# Patient Record
Sex: Female | Born: 1935 | Race: White | Hispanic: No | Smoking: Never smoker
Health system: Southern US, Community
[De-identification: ages and names within clinical notes are randomized; demographics above are authoritative.]

## PROBLEM LIST (undated history)

## (undated) DIAGNOSIS — K5900 Constipation, unspecified: Secondary | ICD-10-CM

## (undated) DIAGNOSIS — I4891 Unspecified atrial fibrillation: Secondary | ICD-10-CM

## (undated) DIAGNOSIS — M1990 Unspecified osteoarthritis, unspecified site: Secondary | ICD-10-CM

## (undated) DIAGNOSIS — E785 Hyperlipidemia, unspecified: Secondary | ICD-10-CM

## (undated) DIAGNOSIS — K222 Esophageal obstruction: Secondary | ICD-10-CM

## (undated) DIAGNOSIS — Z9981 Dependence on supplemental oxygen: Secondary | ICD-10-CM

## (undated) DIAGNOSIS — J9610 Chronic respiratory failure, unspecified whether with hypoxia or hypercapnia: Secondary | ICD-10-CM

## (undated) DIAGNOSIS — E871 Hypo-osmolality and hyponatremia: Secondary | ICD-10-CM

## (undated) DIAGNOSIS — I10 Essential (primary) hypertension: Secondary | ICD-10-CM

## (undated) DIAGNOSIS — G4700 Insomnia, unspecified: Secondary | ICD-10-CM

## (undated) DIAGNOSIS — K449 Diaphragmatic hernia without obstruction or gangrene: Secondary | ICD-10-CM

## (undated) DIAGNOSIS — K2970 Gastritis, unspecified, without bleeding: Secondary | ICD-10-CM

## (undated) DIAGNOSIS — F0390 Unspecified dementia without behavioral disturbance: Secondary | ICD-10-CM

## (undated) DIAGNOSIS — F419 Anxiety disorder, unspecified: Secondary | ICD-10-CM

## (undated) DIAGNOSIS — E039 Hypothyroidism, unspecified: Secondary | ICD-10-CM

## (undated) DIAGNOSIS — E119 Type 2 diabetes mellitus without complications: Secondary | ICD-10-CM

## (undated) HISTORY — DX: Hypothyroidism, unspecified: E03.9

## (undated) HISTORY — DX: Chronic respiratory failure, unspecified whether with hypoxia or hypercapnia: J96.10

## (undated) HISTORY — PX: CHOLECYSTECTOMY: SHX55

## (undated) HISTORY — DX: Insomnia, unspecified: G47.00

## (undated) HISTORY — PX: ABDOMINAL HYSTERECTOMY: SHX81

## (undated) HISTORY — PX: THYROID SURGERY: SHX805

## (undated) HISTORY — DX: Esophageal obstruction: K22.2

## (undated) HISTORY — PX: APPENDECTOMY: SHX54

## (undated) HISTORY — DX: Hypo-osmolality and hyponatremia: E87.1

## (undated) HISTORY — PX: TONSILLECTOMY: SUR1361

## (undated) HISTORY — DX: Unspecified dementia, unspecified severity, without behavioral disturbance, psychotic disturbance, mood disturbance, and anxiety: F03.90

## (undated) HISTORY — DX: Unspecified osteoarthritis, unspecified site: M19.90

## (undated) HISTORY — DX: Diaphragmatic hernia without obstruction or gangrene: K44.9

## (undated) HISTORY — DX: Anxiety disorder, unspecified: F41.9

## (undated) HISTORY — DX: Dependence on supplemental oxygen: Z99.81

## (undated) HISTORY — PX: OTHER SURGICAL HISTORY: SHX169

## (undated) HISTORY — DX: Hyperlipidemia, unspecified: E78.5

## (undated) HISTORY — DX: Gastritis, unspecified, without bleeding: K29.70

## (undated) HISTORY — DX: Constipation, unspecified: K59.00

## (undated) HISTORY — DX: Type 2 diabetes mellitus without complications: E11.9

## (undated) HISTORY — DX: Unspecified atrial fibrillation: I48.91

## (undated) HISTORY — DX: Essential (primary) hypertension: I10

---

## 2020-01-21 DIAGNOSIS — I4510 Unspecified right bundle-branch block: Secondary | ICD-10-CM | POA: Insufficient documentation

## 2021-01-11 ENCOUNTER — Institutional Professional Consult (permissible substitution): Payer: Medicare Other | Admitting: Emergency Medicine

## 2021-01-12 ENCOUNTER — Encounter: Payer: Self-pay | Admitting: Pulmonary Disease

## 2021-01-12 DIAGNOSIS — E119 Type 2 diabetes mellitus without complications: Secondary | ICD-10-CM | POA: Insufficient documentation

## 2021-01-13 ENCOUNTER — Ambulatory Visit (INDEPENDENT_AMBULATORY_CARE_PROVIDER_SITE_OTHER): Payer: Medicare Other

## 2021-01-13 ENCOUNTER — Ambulatory Visit (INDEPENDENT_AMBULATORY_CARE_PROVIDER_SITE_OTHER): Payer: Medicare Other | Admitting: Pulmonary Disease

## 2021-01-13 ENCOUNTER — Encounter: Payer: Self-pay | Admitting: Pulmonary Disease

## 2021-01-13 ENCOUNTER — Other Ambulatory Visit: Payer: Self-pay

## 2021-01-13 VITALS — BP 116/74 | HR 51

## 2021-01-13 DIAGNOSIS — R0902 Hypoxemia: Secondary | ICD-10-CM | POA: Diagnosis not present

## 2021-01-13 NOTE — Patient Instructions (Addendum)
Nice to meet you  We will get a chest x-ray to try to figure out what is going on  I think most likely there is recurrent aspiration even of oropharyngeal secretions that is causing ongoing insult to the lung as well as atelectasis from not being active.  Return to clinic in 3 months or sooner as needed

## 2021-01-13 NOTE — Progress Notes (Signed)
@Patient  ID: , female    DOB: [birth year], 87 y.o.   MRN: [id number]  Chief Complaint  Patient presents with   Consult    Referred from nursing facility for recurrent PNA and coughing up blood about a week ago. Daughter states the blood that she coughed up was a quarter size spot, brownish black color. Has had PNA since at least May 2022 after a fall in June 2022.      Referring provider: [place], NP  HPI:   87 y.o. whom we are seeing in consultation for evaluation of hypoxemic respiratory failure and reported recurrent pneumonia.  Note from skilled nursing facility reviewed.  History largely obtained from daughters 1 at visit and another via phone.  She had recurrent falls at her skilled nursing facility most recent second fall May 2022.  She did up in the hospital.  She was treated for "pneumonia."  I cannot see images to verify infiltrate or other abnormality on chest imaging.  However, there is description of nausea, vomiting, esophageal stenosis and inability to take p.o., food, pills, liquids getting stuck.  Sounds like an aspiration event.  She was treated with antibiotics.  She was moved here locally after that incident.  She has been on oxygen since.  There has been no repeat chest x-ray that I can see.  They do not think repeat chest x-rays been done since in June 2022.  They mention recurrent pneumonias.  Discussed that I have no evidence of recurrent pneumonia, no images to review.  Discussed in detail that high suspicion for ongoing aspiration of oropharyngeal secretions given her advanced cognitive impairment as well as her esophageal stenosis/dysmotility.  In addition, it is common to aspirate G-tube feeds via GERD or reflux with high suspicion that could be going on.  Lastly, discussed physiology of atelectasis and given her lack of activity, largely bedbound, degree of shunt physiology is likely contributing to her oxygen need.  They wish to minimize trips  out of her facility.  But they do not try to get to the bottom of what is going on.  Denies any significant respiratory issues preceding fall in May 2022.  She had COVID earlier in the spring and was just more like a cold, runny nose, little bit of a cough, no respiratory issues.  No history of recurrent pneumonias or bronchitis.  No history of asthma or respiratory issues as a child.  PMH: Obtained via EMR due to patient's advanced cognitive impairment, history of C. difficile, dementia, diabetes, subdural stenosis, arthritis, hypertension, atrial fibrillation Surgical history: Unable to obtain due to advanced dementia Family history: Discussed with daughters, no respiratory illness vertigo fluids, cannot obtain from patient due to advanced dementia, social history: Never smoker, moved [place] 11[facility]19[facility] [place] hospitalization summer 2022 in [place] / Pulmonary Flowsheets:   ACT:  No flowsheet data found.  MMRC: No flowsheet data found.  Epworth:  No flowsheet data found.  Tests:   FENO:  No results found for: NITRICOXIDE  PFT: No flowsheet data found.  WALK:  No flowsheet data found.  Imaging: No results found.  Lab Results:  CBC No results found for: WBC, RBC, HGB, HCT, PLT, MCV, MCH, MCHC, RDW, LYMPHSABS, MONOABS, EOSABS, BASOSABS  BMET No results found for: NA, K, CL, CO2, GLUCOSE, BUN, CREATININE, CALCIUM, GFRNONAA, GFRAA  BNP No results found for: BNP  ProBNP No results found for: PROBNP  Specialty Problems   None  Allergies  Allergen Reactions   Ciprofloxacin    Crestor [Rosuvastatin]    Hydrocodone    Latex    Sulfadiazine      There is no immunization history on file for this patient.  Past Medical History:  Diagnosis Date   Anxiety    Arthritis    Atrial fibrillation (HCC)    Chronic respiratory failure (HCC)    Dementia (HCC)    Esophageal stenosis status post PEG placement     Hyperlipidemia    Hypertension    Hyponatremia    Hypothyroidism    Insomnia    Type 2 diabetes mellitus (HCC)     Tobacco History: Social History   Tobacco Use  Smoking Status Never  Smokeless Tobacco Never   Counseling given: Not Answered   Continue to not smoke  Outpatient Encounter Medications as of 01/13/2021  Medication Sig   acetaminophen (TYLENOL) 325 MG tablet Take 325 mg by mouth.   ASPIRIN LOW DOSE 81 MG chewable tablet Chew 81 mg by mouth daily.   bisacodyl (DULCOLAX) 10 MG suppository Place 10 mg rectally daily.   CALCIUM + VITAMIN D3 600-10 MG-MCG TABS Take by mouth daily as needed.   cholecalciferol (VITAMIN D) 25 MCG (1000 UNIT) tablet Take 1,000 Units by mouth daily.   cholestyramine (QUESTRAN) 4 GM/DOSE powder Take by mouth.   diclofenac Sodium (VOLTAREN) 1 % GEL Apply topically.   diltiazem (CARDIZEM) 120 MG tablet Take 120 mg by mouth 2 (two) times daily.   estradiol (ESTRACE) 0.1 MG/GM vaginal cream Place vaginally.   famotidine (PEPCID) 20 MG tablet Take 20 mg by mouth daily.   furosemide (LASIX) 20 MG tablet Take 20 mg by mouth daily.   gabapentin (NEURONTIN) 250 MG/5ML solution Take by mouth.   levothyroxine (SYNTHROID) 125 MCG tablet Take 125 mcg by mouth daily.   linezolid (ZYVOX) 600 MG tablet Take 600 mg by mouth 2 (two) times daily.   LORazepam (ATIVAN) 0.5 MG tablet Take 0.5 mg by mouth daily as needed.   melatonin 3 MG TABS tablet Take 3 mg by mouth at bedtime.   metoprolol tartrate (LOPRESSOR) 25 MG tablet Take 25 mg by mouth 2 (two) times daily.   nitroGLYCERIN (NITROSTAT) 0.4 MG SL tablet Place under the tongue.   NOVOLOG FLEXPEN 100 UNIT/ML FlexPen Inject into the skin.   NYAMYC powder Apply topically.   OXcarbazepine (TRILEPTAL) 150 MG tablet Take 150 mg by mouth 2 (two) times daily.   pantoprazole sodium (PROTONIX) 40 mg Take by mouth.   polyethylene glycol powder (GLYCOLAX/MIRALAX) 17 GM/SCOOP powder Take 17 g by mouth daily.    Probiotic Product (BACID) CAPS Take by mouth.   QUEtiapine (SEROQUEL) 25 MG tablet Take by mouth.   sertraline (ZOLOFT) 25 MG tablet Take 25 mg by mouth daily.   spironolactone (ALDACTONE) 25 MG tablet Take by mouth.   traMADol (ULTRAM) 50 MG tablet Take 50 mg by mouth every 6 (six) hours as needed.   traZODone (DESYREL) 50 MG tablet Take 50 mg by mouth at bedtime.   zinc oxide 20 % ointment Apply topically.   No facility-administered encounter medications on file as of 01/13/2021.     Review of Systems  Review of Systems  Unobtainable due to patient's dementia Physical Exam  BP 116/74 (BP Location: Right Arm, Patient Position: Sitting, Cuff Size: Normal)   Pulse (!) 51   SpO2 100% Comment: on 2L  Wt Readings from Last 5 Encounters:  No data found for [employer]  BMI Readings from Last 5 Encounters:  No data found for BMI     Physical Exam General: Chronically ill-appearing, lying in portable chair Eyes: EOMI, no icterus Neck: Supple, no JVP appreciated Pulmonary: Clear anterior and apical posterior lung fields, normal work of breathing, on nasal cannula Cardiovascular: Regular rate and rhythm, no murmur Abdomen: Nondistended, G-tube in place, clean dry and intact insertion MSK: No synovitis, joint effusion Neuro: No focal deficit, bedbound Psych: Flat affect, inattentive, sometimes respond to questions   Assessment & Plan:   Chronic hypoxemic respiratory failure: On nasal cannula at visit today.  Unclear if true oxygen requirement, no real records to review.  Stems from hospitalization after fall 08/2020.  Based on description of events almost certain there is a component of aspiration pneumonia.  High suspicion for ongoing aspiration of oropharyngeal secretions as well as risk of reflux of tube feeds and aspiration in the setting of her advanced cognitive impairment.  In addition, she is essentially bedbound, spends most of her day on her back.  High suspicion for ongoing  atelectasis and shunt physiology.   --Chest x-ray today   Return in about 3 months (around 04/14/2021).   Karren Burly, MD 01/13/2021  I spent 65 minutes in the care of this patient including review of outside records, face-to-face care, coordination of care.

## 2021-01-20 ENCOUNTER — Other Ambulatory Visit: Payer: Self-pay | Admitting: Gastroenterology

## 2021-01-20 ENCOUNTER — Other Ambulatory Visit (HOSPITAL_COMMUNITY): Payer: Self-pay | Admitting: Gastroenterology

## 2021-01-20 DIAGNOSIS — R1310 Dysphagia, unspecified: Secondary | ICD-10-CM

## 2021-01-25 ENCOUNTER — Other Ambulatory Visit (HOSPITAL_COMMUNITY): Payer: Self-pay | Admitting: Gastroenterology

## 2021-01-25 ENCOUNTER — Other Ambulatory Visit: Payer: Self-pay

## 2021-01-25 ENCOUNTER — Ambulatory Visit (HOSPITAL_COMMUNITY)
Admission: RE | Admit: 2021-01-25 | Discharge: 2021-01-25 | Disposition: A | Discharge status: Home / Self Care | Payer: Medicare Other | Source: Ambulatory Visit | Attending: Gastroenterology | Admitting: Gastroenterology

## 2021-01-25 DIAGNOSIS — R1310 Dysphagia, unspecified: Secondary | ICD-10-CM

## 2021-01-26 ENCOUNTER — Other Ambulatory Visit (HOSPITAL_COMMUNITY): Payer: Self-pay | Admitting: Gastroenterology

## 2021-01-26 ENCOUNTER — Other Ambulatory Visit: Payer: Self-pay | Admitting: Gastroenterology

## 2021-01-26 DIAGNOSIS — R1310 Dysphagia, unspecified: Secondary | ICD-10-CM

## 2021-02-09 ENCOUNTER — Ambulatory Visit (HOSPITAL_COMMUNITY): Admission: RE | Admit: 2021-02-09 | Payer: Medicare Other | Source: Ambulatory Visit

## 2021-02-16 ENCOUNTER — Ambulatory Visit (HOSPITAL_COMMUNITY)
Admission: RE | Admit: 2021-02-16 | Discharge: 2021-02-16 | Disposition: A | Discharge status: Home / Self Care | Payer: Medicare Other | Source: Ambulatory Visit | Attending: Gastroenterology | Admitting: Gastroenterology

## 2021-02-16 DIAGNOSIS — R1310 Dysphagia, unspecified: Secondary | ICD-10-CM | POA: Diagnosis not present

## 2021-02-17 ENCOUNTER — Other Ambulatory Visit (HOSPITAL_COMMUNITY): Payer: Self-pay | Admitting: *Deleted

## 2021-02-17 DIAGNOSIS — R1310 Dysphagia, unspecified: Secondary | ICD-10-CM

## 2021-02-17 DIAGNOSIS — R059 Cough, unspecified: Secondary | ICD-10-CM

## 2021-02-24 ENCOUNTER — Ambulatory Visit (HOSPITAL_COMMUNITY)
Admission: RE | Admit: 2021-02-24 | Discharge: 2021-02-24 | Disposition: A | Discharge status: Home / Self Care | Payer: Medicare Other | Source: Ambulatory Visit | Attending: Gastroenterology | Admitting: Gastroenterology

## 2021-02-24 ENCOUNTER — Other Ambulatory Visit: Payer: Self-pay

## 2021-02-24 DIAGNOSIS — R059 Cough, unspecified: Secondary | ICD-10-CM | POA: Diagnosis present

## 2021-02-24 DIAGNOSIS — R1310 Dysphagia, unspecified: Secondary | ICD-10-CM | POA: Diagnosis present

## 2021-02-24 NOTE — Progress Notes (Signed)
Objective Swallowing Evaluation: Type of Study: MBS-Modified Barium Swallow Study   Patient Details  Name: Sarah Boone MRN: [id number] Date of Birth: 1933-05-31  Today's Date: 02/24/2021 Time: SLP Start Time (ACUTE ONLY): 1120 -SLP Stop Time (ACUTE ONLY): 1148  SLP Time Calculation (min) (ACUTE ONLY): 28 min   Past Medical History:  Past Medical History:  Diagnosis Date   Anxiety    Arthritis    Atrial fibrillation (HCC)    Chronic respiratory failure (HCC)    Dementia (HCC)    Esophageal stenosis status post PEG placement    Hyperlipidemia    Hypertension    Hyponatremia    Hypothyroidism    Insomnia    Type 2 diabetes mellitus (HCC)    Past Surgical History:  Past Surgical History:  Procedure Laterality Date   THYROID SURGERY     vaginal polyps excision     HPI: Pt is a 87 y.o. female who presents for OP MBS to see if able to initate oral diet given pt's primary nutritional needs are being fulfilled via PEG tube. There have also been some recent concerns for aspiration PNA with daughter reporting pending CXR. Upper GI series perfomed 02/16/21 revealed: Moderate esophageal dysmotility; No frank aspiration seen on limited cervical esophagram with suboptimal views. PMH:  difficile, dementia, diabetes, subdural stenosis, arthritis, hypertension, atrial fibrillation.   No data recorded   Assessment / Plan / Recommendation  CHL IP CLINICAL IMPRESSIONS 02/24/2021  Clinical Impression Pt presents with oropharyngeal dysphagia c/b decreased bolus cohesion, reduced BOT retraction and laryngeal vestibule closure resulting in consistent penetration of thin liquids with full ejection from airway (PAS 2). Reduced bolus cohesion was evident in min oral residuals, cleared with spontaneous repeat swallows. Minimal vallecular and pyriform sinus residue also noted, primarily with thin liquids, which were also cleared with dry swallows via strength of pharyngeal stripping wave. Intermittent  coughing exhibited during study, however no aspiration observed via fluoro and suspect no relation to POs. Pill with thin hung in cervical esophagus and cleared with sip of NTL. Recommend initation of dysphagia 3 (mechanical soft), thin liquid diet with adherence to universal swallow precautions (small bites/sips, upright positioning, slow rate of intake, etc.) Pt should be fully supervised during all meals to increase safety and implementation of strategies. Recommend SNF SLP to f/u in regards to swallow function at bedside as care team/family initiates process of removal of PEG tube. Discussed results and recommendations with pt and daughter who expressed agreement.   SLP Visit Diagnosis Dysphagia, oropharyngeal phase (R13.12)  Attention and concentration deficit following --  Frontal lobe and executive function deficit following --  Impact on safety and function Mild aspiration risk      No flowsheet data found.   Prognosis 02/24/2021  Prognosis for Safe Diet Advancement Good  Barriers to Reach Goals Cognitive deficits;Time post onset  Barriers/Prognosis Comment --    CHL IP DIET RECOMMENDATION 02/24/2021  SLP Diet Recommendations Thin liquid;Dysphagia 3 (Mech soft) solids  Liquid Administration via Cup;Straw  Medication Administration Whole meds with liquid  Compensations Minimize environmental distractions;Slow rate;Small sips/bites;Clear throat intermittently;Effortful swallow  Postural Changes Seated upright at 90 degrees;Remain semi-upright after after feeds/meals (Comment)      CHL IP OTHER RECOMMENDATIONS 02/24/2021  Recommended Consults --  Oral Care Recommendations Oral care BID  Other Recommendations --      CHL IP FOLLOW UP RECOMMENDATIONS 02/24/2021  Follow up Recommendations Skilled Nursing facility      No flowsheet data found.  CHL IP ORAL PHASE 02/24/2021  Oral Phase Impaired  Oral - Pudding Teaspoon --  Oral - Pudding Cup --  Oral - Honey Teaspoon  --  Oral - Honey Cup --  Oral - Nectar Teaspoon --  Oral - Nectar Cup --  Oral - Nectar Straw --  Oral - Thin Teaspoon --  Oral - Thin Cup Decreased bolus cohesion  Oral - Thin Straw Decreased bolus cohesion  Oral - Puree Decreased bolus cohesion;Lingual/palatal residue  Oral - Mech Soft --  Oral - Regular Decreased bolus cohesion;Lingual/palatal residue  Oral - Multi-Consistency --  Oral - Pill Decreased bolus cohesion  Oral Phase - Comment --    CHL IP PHARYNGEAL PHASE 02/24/2021  Pharyngeal Phase Impaired  Pharyngeal- Pudding Teaspoon --  Pharyngeal --  Pharyngeal- Pudding Cup --  Pharyngeal --  Pharyngeal- Honey Teaspoon --  Pharyngeal --  Pharyngeal- Honey Cup --  Pharyngeal --  Pharyngeal- Nectar Teaspoon --  Pharyngeal --  Pharyngeal- Nectar Cup --  Pharyngeal --  Pharyngeal- Nectar Straw --  Pharyngeal --  Pharyngeal- Thin Teaspoon --  Pharyngeal --  Pharyngeal- Thin Cup Penetration/Aspiration during swallow;Reduced airway/laryngeal closure;Pharyngeal residue - pyriform;Pharyngeal residue - valleculae  Pharyngeal Material enters airway, remains ABOVE vocal cords then ejected out  Pharyngeal- Thin Straw Penetration/Aspiration during swallow;Reduced airway/laryngeal closure;Pharyngeal residue - pyriform;Pharyngeal residue - valleculae  Pharyngeal Material enters airway, remains ABOVE vocal cords then ejected out  Pharyngeal- Puree WFL  Pharyngeal --  Pharyngeal- Mechanical Soft --  Pharyngeal --  Pharyngeal- Regular WFL  Pharyngeal --  Pharyngeal- Multi-consistency --  Pharyngeal --  Pharyngeal- Pill WFL  Pharyngeal --  Pharyngeal Comment --     CHL IP CERVICAL ESOPHAGEAL PHASE 02/24/2021  Cervical Esophageal Phase Impaired  Pudding Teaspoon --  Pudding Cup --  Honey Teaspoon --  Honey Cup --  Nectar Teaspoon --  Nectar Cup --  Nectar Straw --  Thin Teaspoon --  Thin Cup --  Thin Straw --  Puree --  Mechanical Soft --  Regular --   Multi-consistency --  Pill Other (Comment)  Cervical Esophageal Comment --    Avie Echevaria, MA, CCC-SLP Acute Rehabilitation Services Office Number: [phone]  Paulette Blanch 02/24/2021, 2:56 PM

## 2021-03-02 ENCOUNTER — Telehealth: Payer: Self-pay | Admitting: Rehabilitative and Restorative Service Providers"

## 2021-03-02 NOTE — Telephone Encounter (Signed)
Called and spoke with daughter and then facility in order to cancel SLP eval for MOnday.  Patient resides in SNF and should receive services at facility.  Called to ensure transportation from facility was cancelled. Shante Archambeault, PT

## 2021-03-06 ENCOUNTER — Ambulatory Visit: Payer: Medicare Other

## 2021-03-09 ENCOUNTER — Other Ambulatory Visit (HOSPITAL_COMMUNITY): Payer: Self-pay | Admitting: Gastroenterology

## 2021-03-09 DIAGNOSIS — R1310 Dysphagia, unspecified: Secondary | ICD-10-CM

## 2021-03-09 DIAGNOSIS — Z431 Encounter for attention to gastrostomy: Secondary | ICD-10-CM

## 2021-03-10 ENCOUNTER — Ambulatory Visit (HOSPITAL_COMMUNITY)
Admission: RE | Admit: 2021-03-10 | Discharge: 2021-03-10 | Disposition: A | Discharge status: Home / Self Care | Payer: Medicare Other | Source: Ambulatory Visit | Attending: Gastroenterology | Admitting: Gastroenterology

## 2021-03-10 ENCOUNTER — Other Ambulatory Visit: Payer: Self-pay

## 2021-03-10 ENCOUNTER — Encounter (HOSPITAL_COMMUNITY): Payer: Self-pay | Admitting: Radiology

## 2021-03-10 ENCOUNTER — Other Ambulatory Visit (HOSPITAL_COMMUNITY): Payer: Self-pay | Admitting: Gastroenterology

## 2021-03-10 DIAGNOSIS — R1310 Dysphagia, unspecified: Secondary | ICD-10-CM

## 2021-03-10 DIAGNOSIS — Z431 Encounter for attention to gastrostomy: Secondary | ICD-10-CM

## 2021-03-10 HISTORY — PX: IR PATIENT EVAL TECH 0-60 MINS: IMG5564

## 2021-03-10 NOTE — Procedures (Signed)
Please see physician notes

## 2021-03-10 NOTE — Progress Notes (Signed)
Patient presents with fractured percutaneous gastrostomy tube.   The hub of the gtube was found to be fractured.  The tube was amputated proximal to the fractured portion, and a new add-on hub/adapter was placed.  Flush confirmed function.   Patient discharged.   Signed,  Yvone Neu. Loreta Ave, DO

## 2021-04-03 ENCOUNTER — Other Ambulatory Visit: Payer: Self-pay

## 2021-04-03 ENCOUNTER — Encounter (HOSPITAL_COMMUNITY): Payer: Self-pay | Admitting: Emergency Medicine

## 2021-04-03 ENCOUNTER — Emergency Department (HOSPITAL_COMMUNITY)
Admission: EM | Admit: 2021-04-03 | Discharge: 2021-04-03 | Disposition: A | Discharge status: Skilled Nursing Facility | Payer: Medicare Other | Attending: Emergency Medicine | Admitting: Emergency Medicine

## 2021-04-03 DIAGNOSIS — Z794 Long term (current) use of insulin: Secondary | ICD-10-CM | POA: Insufficient documentation

## 2021-04-03 DIAGNOSIS — Z79899 Other long term (current) drug therapy: Secondary | ICD-10-CM | POA: Insufficient documentation

## 2021-04-03 DIAGNOSIS — F0390 Unspecified dementia without behavioral disturbance: Secondary | ICD-10-CM | POA: Insufficient documentation

## 2021-04-03 DIAGNOSIS — Z7982 Long term (current) use of aspirin: Secondary | ICD-10-CM | POA: Insufficient documentation

## 2021-04-03 DIAGNOSIS — Z91040 Latex allergy status: Secondary | ICD-10-CM | POA: Diagnosis not present

## 2021-04-03 DIAGNOSIS — E119 Type 2 diabetes mellitus without complications: Secondary | ICD-10-CM | POA: Diagnosis not present

## 2021-04-03 DIAGNOSIS — E039 Hypothyroidism, unspecified: Secondary | ICD-10-CM | POA: Diagnosis not present

## 2021-04-03 DIAGNOSIS — T50901A Poisoning by unspecified drugs, medicaments and biological substances, accidental (unintentional), initial encounter: Secondary | ICD-10-CM

## 2021-04-03 DIAGNOSIS — T402X1A Poisoning by other opioids, accidental (unintentional), initial encounter: Secondary | ICD-10-CM | POA: Insufficient documentation

## 2021-04-03 MED ORDER — DILTIAZEM HCL ER 60 MG PO CP12
120.0000 mg | ORAL_CAPSULE | Freq: Once | ORAL | Status: AC
  Administered 2021-04-03: 120 mg via ORAL
  Filled 2021-04-03: qty 2

## 2021-04-03 MED ORDER — METOPROLOL TARTRATE 25 MG PO TABS: 25.0000 mg | ORAL_TABLET | Freq: Once | ORAL | Status: DC

## 2021-04-03 NOTE — ED Provider Notes (Signed)
[facility]-EMERGENCY DEPT Provider Note   CSN: [id number] Arrival date & time: 04/03/21  0141     History Chief Complaint  Patient presents with   Allergic Reaction    Sarah Boone is a 87 y.o. female.  The history is provided by the patient, the EMS personnel and medical records.  Allergic Reaction Sarah Boone is a 87 y.o. female who presents to the Emergency Department complaining of possible allergic reaction. She presents to the emergency department by EMS from [facility] for evaluation of possible allergic reaction. She was accidentally given her roommate medications at 2355. These medications include buspirone 5 mg, donepezil 10 mg, hydrocodone 5/325 and Namenda 10 mg. The patient has a documented allergy to hydrocodone with unknown severity and staff was concerned for possible allergy. On assessment in the emergency department patient complains of chronic pain as well as itching. She states her itching has been present for several days and is located on her central chest. No difficulty breathing. No new symptoms.  The patient's medications include acetaminophen 325, diltiazem 120, estradiol, famotidine, furosemide, levothyroxine, metoprolol, NovoLog, tramadol, Remeron, Benadryl, Zoloft    Past Medical History:  Diagnosis Date   Anxiety    Arthritis    Atrial fibrillation (HCC)    Chronic respiratory failure (HCC)    Dementia (HCC)    Esophageal stenosis status post PEG placement    Hyperlipidemia    Hypertension    Hyponatremia    Hypothyroidism    Insomnia    Type 2 diabetes mellitus (HCC)     Patient Active Problem List   Diagnosis Date Noted   Type 2 diabetes mellitus (HCC) 01/12/2021    Past Surgical History:  Procedure Laterality Date   IR PATIENT EVAL TECH 0-60 MINS  03/10/2021   THYROID SURGERY     vaginal polyps excision       OB History   No obstetric history on file.     History reviewed. No pertinent family  history.  Social History   Tobacco Use   Smoking status: Never   Smokeless tobacco: Never    Home Medications Prior to Admission medications   Medication Sig Start Date End Date Taking? Authorizing Provider  acetaminophen (TYLENOL) 325 MG tablet Take 325 mg by mouth. 11/03/20   [provider]  ASPIRIN LOW DOSE 81 MG chewable tablet Chew 81 mg by mouth daily. 01/07/21   [provider]  bisacodyl (DULCOLAX) 10 MG suppository Place 10 mg rectally daily. 11/03/20   [provider]  CALCIUM + VITAMIN D3 600-10 MG-MCG TABS Take by mouth daily as needed. 11/03/20   [provider]  cholecalciferol (VITAMIN D) 25 MCG (1000 UNIT) tablet Take 1,000 Units by mouth daily. 01/07/21   [provider]  cholestyramine Lanetta Inch) 4 GM/DOSE powder Take by mouth. 12/19/20   [provider]  diclofenac Sodium (VOLTAREN) 1 % GEL Apply topically. 11/08/20   [provider]  diltiazem (CARDIZEM) 120 MG tablet Take 120 mg by mouth 2 (two) times daily. 12/25/20   [provider]  estradiol (ESTRACE) 0.1 MG/GM vaginal cream Place vaginally. 12/21/20   [provider]  famotidine (PEPCID) 20 MG tablet Take 20 mg by mouth daily. 01/07/21   [provider]  furosemide (LASIX) 20 MG tablet Take 20 mg by mouth daily. 01/07/21   [provider]  gabapentin (NEURONTIN) 250 MG/5ML solution Take by mouth. 11/03/20   [provider]  levothyroxine (SYNTHROID) 125 MCG tablet Take 125  mcg by mouth daily. 12/28/20   [provider]  linezolid (ZYVOX) 600 MG tablet Take 600 mg by mouth 2 (two) times daily. 11/14/20   [provider]  LORazepam (ATIVAN) 0.5 MG tablet Take 0.5 mg by mouth daily as needed. 08/16/20   [provider]  melatonin 3 MG TABS tablet Take 3 mg by mouth at bedtime. 12/30/20   [provider]  metoprolol tartrate (LOPRESSOR) 25 MG tablet Take 25 mg by mouth 2 (two) times daily.  12/15/20   [provider]  nitroGLYCERIN (NITROSTAT) 0.4 MG SL tablet Place under the tongue. 11/03/20   [provider]  NOVOLOG FLEXPEN 100 UNIT/ML FlexPen Inject into the skin. 11/14/20   [provider]  [facility] powder Apply topically. 07/19/20   [provider]  OXcarbazepine (TRILEPTAL) 150 MG tablet Take 150 mg by mouth 2 (two) times daily. 11/03/20   [provider]  pantoprazole sodium (PROTONIX) 40 mg Take by mouth. 12/07/20   [provider]  polyethylene glycol powder (GLYCOLAX/MIRALAX) 17 GM/SCOOP powder Take 17 g by mouth daily. 11/03/20   [provider]  Probiotic Product (BACID) CAPS Take by mouth. 12/07/20   [provider]  QUEtiapine (SEROQUEL) 25 MG tablet Take by mouth. 11/14/20   [provider]  sertraline (ZOLOFT) 25 MG tablet Take 25 mg by mouth daily. 01/02/21   [provider]  spironolactone (ALDACTONE) 25 MG tablet Take by mouth. 11/03/20   [provider]  traMADol (ULTRAM) 50 MG tablet Take 50 mg by mouth every 6 (six) hours as needed. 01/02/21   [provider]  traZODone (DESYREL) 50 MG tablet Take 50 mg by mouth at bedtime. 11/03/20   [provider]  zinc oxide 20 % ointment Apply topically. 12/21/20   [provider]    Allergies    Ciprofloxacin, Crestor [rosuvastatin], Hydrocodone, Latex, and Sulfadiazine  Review of Systems   Review of Systems  All other systems reviewed and are negative.  Physical Exam Updated Vital Signs BP (!) 102/57   Pulse 95   Temp 98.5 F (36.9 C)   Resp 18   Ht 5\' 4"  (1.626 m)   Wt 81.6 kg   SpO2 95%   BMI 30.90 kg/m   Physical Exam Vitals and nursing note reviewed.  Constitutional:      Appearance: She is well-developed.  HENT:     Head: Normocephalic and atraumatic.  Cardiovascular:     Rate and Rhythm: Normal rate and regular rhythm.     Heart sounds: No murmur heard. Pulmonary:     Effort: Pulmonary  effort is normal. No respiratory distress.     Breath sounds: Normal breath sounds.  Abdominal:     Palpations: Abdomen is soft.     Tenderness: There is no abdominal tenderness. There is no guarding or rebound.     Comments: Peg tube in the left upper quadrant  Musculoskeletal:        General: No tenderness.  Skin:    General: Skin is warm and dry.     Comments: There is a rash to the central chest with local erythema and scaling and crusting lesions.  Neurological:     Mental Status: She is alert.     Comments: Alert and interactive.  Oriented to place  Psychiatric:        Behavior: Behavior normal.    ED Results / Procedures / Treatments   Labs (all labs ordered are listed, but only abnormal  results are displayed) Labs Reviewed - No data to display  EKG None  Radiology No results found.  Procedures Procedures   Medications Ordered in ED Medications  metoprolol tartrate (LOPRESSOR) tablet 25 mg (0 mg Per Tube Hold 04/03/21 0332)  diltiazem (CARDIZEM SR) 12 hr capsule 120 mg (120 mg Oral Given 04/03/21 0630)    ED Course  I have reviewed the triage vital signs and the nursing notes.  Pertinent labs & imaging results that were available during my care of the patient were reviewed by me and considered in my medical decision making (see chart for details).    MDM Rules/Calculators/A&P                           patient with history of diabetes, a fib here for evaluation after accidental medication ingestion. Nursing staff was concerned due to her receiving hydrocodone and she has hydrocodone listed as an allergy. Patient is asymptomatic on evaluation the emergency department. She did miss her evening medications, which include diltiazem. She was given her home dose of diltiazem in the department. No evidence of anaphylaxis or systemic symptoms due to ingestion. Discussed with patient and patient's daughter over the phone. Plan to discharge back to facility. Final Clinical  Impression(s) / ED Diagnoses Final diagnoses:  Accidental drug ingestion, initial encounter    Rx / DC Orders ED Discharge Orders     None        Tilden Fossa, MD 04/03/21 [phone]

## 2021-04-03 NOTE — ED Triage Notes (Addendum)
[facility] - pt from [facility] after staff gave the pt the wrong medications, buspirone 5mg  PO / donepezil 10mg  PO / hydrocodone 5-325mg  PO / namenda 10mg  PO, at 2355. One of which were hydrocodone, pt states her throat closes. Pt in NAD at this time.

## 2021-04-03 NOTE — Discharge Instructions (Addendum)
Sarah Boone received diltiazem in the emergency department this evening. She did not require any additional medications. Please have her rechecked if she develops any new or concerning symptoms.

## 2021-04-03 NOTE — ED Notes (Signed)
Pt had a small BM. Pt cleaned with new diaper and chuck

## 2021-04-25 ENCOUNTER — Telehealth: Payer: Self-pay | Admitting: Pulmonary Disease

## 2021-04-25 NOTE — Telephone Encounter (Signed)
She was exposed to COVID over the weekend, she has tested negative. Can she still come to her appt tomorrow with [place]? Spoke with Olegario Messier from the facility where the patient resides, she states that the patient was exposed to Covid on 12/21 and is having no symptoms.  She was unsure when she was tested for Covid, however, she tested negative when tested.  Olegario Messier will check with the nurse and find out the date she was tested and if she was tested over the weekend, she will have her retested today and call me back.  Will await her return call.

## 2021-04-25 NOTE — Telephone Encounter (Signed)
Sarah Boone from [facility] returned my call and stated that the patient was exposed to Covid on 12/21, tested negative on 12/25 and 12/27.  She is having no symptoms of covid.  She wants to know if it is ok for the patient to keep her appointment on 12/27.  I advised her that I would make Dr. Judeth Horn aware and once we hear back from him we will call her back with his response.  Dr. Judeth Horn, Please advise if ok for patient to keep her 12/27 appointment with you.  Thank you.

## 2021-04-25 NOTE — Telephone Encounter (Signed)
[name] with [facility] ([facility]) regarding Covid exposure and testing.  I had to leave a VM, LVM to return call to [phone].  Will await return call.

## 2021-04-25 NOTE — Telephone Encounter (Signed)
Ok with me 

## 2021-04-25 NOTE — Telephone Encounter (Incomplete Revision)
Olegario Messier from [facility] returned my call and stated that the patient was exposed to Covid on 12/21, tested negative on 12/25 and 12/27.  She is having no symptoms of covid.  She wants to know if it is ok for the patient to keep her appointment on 12/28.  I advised her that I would make Dr. Judeth Horn aware and once we hear back from him we will call her back with his response.  Dr. Judeth Horn, Please advise if ok for patient to keep her 12/27 appointment with you.  Thank you.

## 2021-04-25 NOTE — Telephone Encounter (Signed)
Called and spoke with Sarah Boone ([facility]), I let her know that it was fine for her to keep her appointment on 12/28 at 11:15 am.  Nothing further needed.

## 2021-04-26 ENCOUNTER — Encounter: Payer: Self-pay | Admitting: Pulmonary Disease

## 2021-04-26 ENCOUNTER — Ambulatory Visit (INDEPENDENT_AMBULATORY_CARE_PROVIDER_SITE_OTHER): Payer: Medicare Other | Admitting: Pulmonary Disease

## 2021-04-26 ENCOUNTER — Other Ambulatory Visit: Payer: Self-pay

## 2021-04-26 VITALS — BP 128/80 | HR 64 | Temp 97.6°F | Wt 168.7 lb

## 2021-04-26 DIAGNOSIS — R053 Chronic cough: Secondary | ICD-10-CM

## 2021-04-26 MED ORDER — BENZONATATE 100 MG PO CAPS: 100.0000 mg | ORAL_CAPSULE | Freq: Two times a day (BID) | ORAL | 6 refills | Status: DC

## 2021-04-26 MED ORDER — IPRATROPIUM-ALBUTEROL 0.5-2.5 (3) MG/3ML IN SOLN: 3.0000 mL | Freq: Four times a day (QID) | RESPIRATORY_TRACT | 6 refills | Status: AC | PRN

## 2021-04-26 NOTE — Patient Instructions (Addendum)
Nice to see you again  For the cough, use Tessalon Perles 1 pill twice a day  Also, try DuoNebs every 6 hours as needed for cough or chest tightness   I provided prescriptions for both of these today  Return to clinic in 6 months or sooner as needed with Dr. Judeth Horn

## 2021-04-26 NOTE — Progress Notes (Signed)
@Patient  ID: , female    DOB: August 07, 1933, 87 y.o.   MRN: [id number]  Chief Complaint  Patient presents with   Follow-up    Pt is here for follow up and is currently on 2L on Oxygen. Has shown no signs of issues with low oxygen.     Referring provider: [id number], NP  HPI:   87 y.o. whom we are seeing in follow up for evaluation of hypoxemic respiratory failure and reported recurrent pneumonia.  Note from skilled nursing facility reviewed.  Overall doing okay.  History largely obtained from 2 sisters, 1 present, 1 via FaceTime.  On 2 L nasal cannula doing well.  O2 sat 98% today.  Has ongoing cough.  Discussed at length concern for ongoing aspiration.  Fortunately recent speech evaluation was reassuring.  Discussed that I am happy about this and I encouraged her to eat as she can.  However, that was a snapshot in time it is possible she has intermittent aspiration events and is at risk for aspiration events in the future.  Discussed treatment of symptoms, cough.  Tessalon Perles prescribed today.  DuoNebs as needed for cough as well as chest tightness as described.  Discussed GERD which could be a possibility.  She is on medications for GERD and still having symptoms.  HPI at initial visit: History largely obtained from daughters 1 at visit and another via phone.  She had recurrent falls at her skilled nursing facility most recent second fall May 2022.  She did up in the hospital.  She was treated for "pneumonia."  I cannot see images to verify infiltrate or other abnormality on chest imaging.  However, there is description of nausea, vomiting, esophageal stenosis and inability to take p.o., food, pills, liquids getting stuck.  Sounds like an aspiration event.  She was treated with antibiotics.  She was moved here locally after that incident.  She has been on oxygen since.  There has been no repeat chest x-ray that I can see.  They do not think repeat chest x-rays been done  since in June 2022.  They mention recurrent pneumonias.  Discussed that I have no evidence of recurrent pneumonia, no images to review.  Discussed in detail that high suspicion for ongoing aspiration of oropharyngeal secretions given her advanced cognitive impairment as well as her esophageal stenosis/dysmotility.  In addition, it is common to aspirate G-tube feeds via GERD or reflux with high suspicion that could be going on.  Lastly, discussed physiology of atelectasis and given her lack of activity, largely bedbound, degree of shunt physiology is likely contributing to her oxygen need.  They wish to minimize trips out of her facility.  But they do not try to get to the bottom of what is going on.  Denies any significant respiratory issues preceding fall in May 2022.  She had COVID earlier in the spring and was just more like a cold, runny nose, little bit of a cough, no respiratory issues.  No history of recurrent pneumonias or bronchitis.  No history of asthma or respiratory issues as a child.  PMH: Obtained via EMR due to patient's advanced cognitive impairment, history of C. difficile, dementia, diabetes, subdural stenosis, arthritis, hypertension, atrial fibrillation Surgical history: Unable to obtain due to advanced dementia Family history: Discussed with daughters, no respiratory illness vertigo fluids, cannot obtain from patient due to advanced dementia, social history: Never smoker, [place] [facility] [place] [facility] [place] [facility] [place] summer 2022 in 12-01-1979  Questionaires / Pulmonary Flowsheets:   ACT:  No flowsheet data found.  MMRC: No flowsheet data found.  Epworth:  No flowsheet data found.  Tests:   FENO:  No results found for: NITRICOXIDE  PFT: No flowsheet data found.  WALK:  No flowsheet data found.  Imaging: No results found.  Lab Results:  CBC No results found for: WBC, RBC, HGB, HCT, PLT, MCV, MCH, MCHC, RDW, LYMPHSABS, MONOABS,  EOSABS, BASOSABS  BMET No results found for: NA, K, CL, CO2, GLUCOSE, BUN, CREATININE, CALCIUM, GFRNONAA, GFRAA  BNP No results found for: BNP  ProBNP No results found for: PROBNP  Specialty Problems   None  Allergies  Allergen Reactions   Ciprofloxacin     Generic    Crestor [Rosuvastatin]    Hydrocodone    Latex    Sulfadiazine     Immunization History  Administered Date(s) Administered   Influenza, High Dose Seasonal PF 02/28/2021    Past Medical History:  Diagnosis Date   Anxiety    Arthritis    Atrial fibrillation (HCC)    Chronic respiratory failure (HCC)    Dementia (HCC)    Esophageal stenosis status post PEG placement    Hyperlipidemia    Hypertension    Hyponatremia    Hypothyroidism    Insomnia    Type 2 diabetes mellitus (HCC)     Tobacco History: Social History   Tobacco Use  Smoking Status Never  Smokeless Tobacco Never   Counseling given: Not Answered   Continue to not smoke  Outpatient Encounter Medications as of 04/26/2021  Medication Sig   acetaminophen (TYLENOL) 325 MG tablet Take 325 mg by mouth.   ASPIRIN LOW DOSE 81 MG chewable tablet Chew 81 mg by mouth daily.   benzonatate (TESSALON PERLES) 100 MG capsule Take 1 capsule (100 mg total) by mouth 2 (two) times daily.   bisacodyl (DULCOLAX) 10 MG suppository Place 10 mg rectally daily.   CALCIUM + VITAMIN D3 600-10 MG-MCG TABS Take by mouth daily as needed.   cholecalciferol (VITAMIN D) 25 MCG (1000 UNIT) tablet Take 1,000 Units by mouth daily.   cholestyramine (QUESTRAN) 4 GM/DOSE powder Take by mouth.   diclofenac Sodium (VOLTAREN) 1 % GEL Apply topically.   diltiazem (CARDIZEM) 120 MG tablet Take 120 mg by mouth 2 (two) times daily.   estradiol (ESTRACE) 0.1 MG/GM vaginal cream Place vaginally.   famotidine (PEPCID) 20 MG tablet Take 20 mg by mouth daily.   furosemide (LASIX) 20 MG tablet Take 20 mg by mouth daily.   gabapentin (NEURONTIN) 250 MG/5ML solution Take by mouth.    ipratropium-albuterol (DUONEB) 0.5-2.5 (3) MG/3ML SOLN Take 3 mLs by nebulization every 6 (six) hours as needed (cough, chest tightness).   levothyroxine (SYNTHROID) 125 MCG tablet Take 125 mcg by mouth daily.   linezolid (ZYVOX) 600 MG tablet Take 600 mg by mouth 2 (two) times daily.   LORazepam (ATIVAN) 0.5 MG tablet Take 0.5 mg by mouth daily as needed.   melatonin 3 MG TABS tablet Take 3 mg by mouth at bedtime.   metoprolol tartrate (LOPRESSOR) 25 MG tablet Take 25 mg by mouth 2 (two) times daily.   nitroGLYCERIN (NITROSTAT) 0.4 MG SL tablet Place under the tongue.   NOVOLOG FLEXPEN 100 UNIT/ML FlexPen Inject into the skin.   NYAMYC powder Apply topically.   OXcarbazepine (TRILEPTAL) 150 MG tablet Take 150 mg by mouth 2 (two) times daily.   pantoprazole sodium (PROTONIX) 40 mg Take by mouth.  polyethylene glycol powder (GLYCOLAX/MIRALAX) 17 GM/SCOOP powder Take 17 g by mouth daily.   Probiotic Product (BACID) CAPS Take by mouth.   QUEtiapine (SEROQUEL) 25 MG tablet Take by mouth.   sertraline (ZOLOFT) 25 MG tablet Take 25 mg by mouth daily.   spironolactone (ALDACTONE) 25 MG tablet Take by mouth.   traMADol (ULTRAM) 50 MG tablet Take 50 mg by mouth every 6 (six) hours as needed.   traZODone (DESYREL) 50 MG tablet Take 50 mg by mouth at bedtime.   zinc oxide 20 % ointment Apply topically.   No facility-administered encounter medications on file as of 04/26/2021.     Review of Systems  Review of Systems  Unobtainable due to patient's dementia Physical Exam  BP 128/80 (BP Location: Left Arm, Patient Position: Sitting, Cuff Size: Normal)    Pulse 64    Temp 97.6 F (36.4 C) (Oral)    Wt 168 lb 11.2 oz (76.5 kg)    SpO2 98%    BMI 28.96 kg/m   Wt Readings from Last 5 Encounters:  04/26/21 168 lb 11.2 oz (76.5 kg)  04/03/21 180 lb (81.6 kg)    BMI Readings from Last 5 Encounters:  04/26/21 28.96 kg/m  04/03/21 30.90 kg/m     Physical Exam General: Chronically  ill-appearing, lying in portable chair Eyes: EOMI, no icterus Neck: Supple, no JVP appreciated Pulmonary: Clear anterior and apical posterior lung fields, normal work of breathing, on nasal cannula Cardiovascular: Regular rate and rhythm, no murmur MSK: No synovitis, joint effusion Neuro: No focal deficit, bedbound Psych: Flat affect, inattentive, sometimes respond to questions   Assessment & Plan:   Chronic hypoxemic respiratory failure: On nasal cannula at visit today.  Unclear if true oxygen requirement, no real records to review.  Stems from hospitalization after fall 08/2020.  Based on description of events almost certain there is a component of aspiration pneumonia even with recent good report from speech pathology, swallowing studies.  High suspicion for ongoing aspiration of oropharyngeal secretions as well as risk of reflux of tube feeds and aspiration in the setting of her advanced cognitive impairment.  In addition, she is essentially bedbound, spends most of her day on her back.  High suspicion for ongoing atelectasis and shunt physiology.    Chronic cough: Likely multifactorial due to GERD, silent or microaspiration events, possible intermittent bronchitis.  Tessalon Perles prescribed.  DuoNebs as needed prescribed.   Return in about 6 months (around 10/25/2021).   Karren Burly, MD 04/26/2021

## 2021-06-14 ENCOUNTER — Emergency Department (HOSPITAL_COMMUNITY)
Admission: EM | Admit: 2021-06-14 | Discharge: 2021-06-14 | Disposition: A | Discharge status: Skilled Nursing Facility | Payer: Medicare Other | Attending: Emergency Medicine | Admitting: Emergency Medicine

## 2021-06-14 ENCOUNTER — Emergency Department (HOSPITAL_COMMUNITY): Payer: Medicare Other

## 2021-06-14 ENCOUNTER — Other Ambulatory Visit: Payer: Self-pay

## 2021-06-14 ENCOUNTER — Encounter (HOSPITAL_COMMUNITY): Payer: Self-pay | Admitting: Radiology

## 2021-06-14 DIAGNOSIS — I10 Essential (primary) hypertension: Secondary | ICD-10-CM | POA: Insufficient documentation

## 2021-06-14 DIAGNOSIS — R4182 Altered mental status, unspecified: Secondary | ICD-10-CM | POA: Insufficient documentation

## 2021-06-14 DIAGNOSIS — E7220 Disorder of urea cycle metabolism, unspecified: Secondary | ICD-10-CM | POA: Insufficient documentation

## 2021-06-14 DIAGNOSIS — R531 Weakness: Secondary | ICD-10-CM | POA: Diagnosis present

## 2021-06-14 DIAGNOSIS — I4891 Unspecified atrial fibrillation: Secondary | ICD-10-CM | POA: Diagnosis not present

## 2021-06-14 DIAGNOSIS — E039 Hypothyroidism, unspecified: Secondary | ICD-10-CM | POA: Diagnosis not present

## 2021-06-14 DIAGNOSIS — N390 Urinary tract infection, site not specified: Secondary | ICD-10-CM | POA: Insufficient documentation

## 2021-06-14 DIAGNOSIS — F0390 Unspecified dementia without behavioral disturbance: Secondary | ICD-10-CM | POA: Insufficient documentation

## 2021-06-14 DIAGNOSIS — R404 Transient alteration of awareness: Secondary | ICD-10-CM | POA: Diagnosis not present

## 2021-06-14 DIAGNOSIS — E119 Type 2 diabetes mellitus without complications: Secondary | ICD-10-CM | POA: Insufficient documentation

## 2021-06-14 DIAGNOSIS — Z20822 Contact with and (suspected) exposure to covid-19: Secondary | ICD-10-CM | POA: Diagnosis not present

## 2021-06-14 LAB — COMPREHENSIVE METABOLIC PANEL
ALT: 16 U/L (ref 0–44)
AST: 22 U/L (ref 15–41)
Albumin: 3.5 g/dL (ref 3.5–5.0)
Alkaline Phosphatase: 113 U/L (ref 38–126)
Anion gap: 8 (ref 5–15)
BUN: 27 mg/dL — ABNORMAL HIGH (ref 8–23)
CO2: 28 mmol/L (ref 22–32)
Calcium: 9.1 mg/dL (ref 8.9–10.3)
Chloride: 98 mmol/L (ref 98–111)
Creatinine, Ser: 0.35 mg/dL — ABNORMAL LOW (ref 0.44–1.00)
GFR, Estimated: >60 mL/min (ref >60)
Glucose, Bld: 137 mg/dL — ABNORMAL HIGH (ref 70–99)
Potassium: 4 mmol/L (ref 3.5–5.1)
Sodium: 134 mmol/L — ABNORMAL LOW (ref 135–145)
Total Bilirubin: 0.8 mg/dL (ref 0.3–1.2)
Total Protein: 7.4 g/dL (ref 6.5–8.1)

## 2021-06-14 LAB — CBC WITH DIFFERENTIAL/PLATELET
Abs Immature Granulocytes: 0.04 10*3/uL (ref 0.00–0.07)
Basophils Absolute: 0 10*3/uL (ref 0.0–0.1)
Basophils Relative: 0 %
Eosinophils Absolute: 0.1 10*3/uL (ref 0.0–0.5)
Eosinophils Relative: 1 %
HCT: 41.9 % (ref 36.0–46.0)
Hemoglobin: 13.3 g/dL (ref 12.0–15.0)
Immature Granulocytes: 0 %
Lymphocytes Relative: 19 %
Lymphs Abs: 1.8 10*3/uL (ref 0.7–4.0)
MCH: 28.8 pg (ref 26.0–34.0)
MCHC: 31.7 g/dL (ref 30.0–36.0)
MCV: 90.7 fL (ref 80.0–100.0)
Monocytes Absolute: 0.6 10*3/uL (ref 0.1–1.0)
Monocytes Relative: 6 %
Neutro Abs: 6.8 10*3/uL (ref 1.7–7.7)
Neutrophils Relative %: 74 %
Platelets: 225 10*3/uL (ref 150–400)
RBC: 4.62 MIL/uL (ref 3.87–5.11)
RDW: 14.6 % (ref 11.5–15.5)
WBC: 9.4 10*3/uL (ref 4.0–10.5)
nRBC: 0 % (ref 0.0–0.2)

## 2021-06-14 LAB — BRAIN NATRIURETIC PEPTIDE: B Natriuretic Peptide: 315.7 pg/mL — ABNORMAL HIGH (ref 0.0–100.0)

## 2021-06-14 LAB — URINALYSIS, ROUTINE W REFLEX MICROSCOPIC
Bilirubin Urine: NEGATIVE
Glucose, UA: NEGATIVE mg/dL
Hgb urine dipstick: NEGATIVE
Ketones, ur: NEGATIVE mg/dL
Nitrite: POSITIVE — AB
Protein, ur: NEGATIVE mg/dL
Specific Gravity, Urine: 1.011 (ref 1.005–1.030)
pH: 8 (ref 5.0–8.0)

## 2021-06-14 LAB — TROPONIN I (HIGH SENSITIVITY)
Troponin I (High Sensitivity): 5 ng/L (ref <18)
Troponin I (High Sensitivity): 5 ng/L (ref <18)

## 2021-06-14 LAB — RESP PANEL BY RT-PCR (FLU A&B, COVID) ARPGX2
Influenza A by PCR: NEGATIVE
Influenza B by PCR: NEGATIVE
SARS Coronavirus 2 by RT PCR: NEGATIVE

## 2021-06-14 LAB — TSH: TSH: 3.628 u[IU]/mL (ref 0.350–4.500)

## 2021-06-14 LAB — AMMONIA: Ammonia: 44 umol/L — ABNORMAL HIGH (ref 9–35)

## 2021-06-14 LAB — CBG MONITORING, ED: Glucose-Capillary: 165 mg/dL — ABNORMAL HIGH (ref 70–99)

## 2021-06-14 LAB — LIPASE, BLOOD: Lipase: 23 U/L (ref 11–51)

## 2021-06-14 MED ORDER — CEPHALEXIN 250 MG PO CAPS
250.0000 mg | ORAL_CAPSULE | Freq: Once | ORAL | Status: AC
  Administered 2021-06-14: 250 mg via ORAL
  Filled 2021-06-14: qty 1

## 2021-06-14 MED ORDER — CEPHALEXIN 250 MG PO CAPS: 250.0000 mg | ORAL_CAPSULE | Freq: Four times a day (QID) | ORAL | 0 refills | Status: DC

## 2021-06-14 NOTE — ED Triage Notes (Signed)
"  Had pneumonia 2 days ago, has been more weak and altered than normal, so the facility wanted to send her to ED for evaluation" per EMS

## 2021-06-14 NOTE — ED Provider Notes (Signed)
4:45 PM-checkout from Dr. Jacqulyn Bath to evaluate urinalysis and consider treatment if positive.  Patient currently on doxycycline for suspected pneumonia however chest x-ray here does not indicate pneumonia.  6:20 PM-urinalysis returned, consistent with UTI.  Urine culture ordered.  Dr. Jacqulyn Bath indicated suggested treatment for UTI if urine is abnormal.  At this time the daughter was concerned about the patient having some shaking of her left leg, and right arm.  I am able to mobilize each extremity however she has some pain with flexion of her knees.  As I was examining her arms and legs there was no tremor.  There is no facial asymmetry.  She is verbal.  She is confused consistent with her baseline dementia status.  Recent vital signs are reassuring.  No indication for further ED intervention or hospitalization.  She is stable for discharge.  Prescription for Keflex given.  First dose started in the ED.   Mancel Bale, MD 06/14/21 [phone]

## 2021-06-14 NOTE — ED Notes (Signed)
Pt's daughter wants staff to be aware if patient is getting medication it needs to be through the feeding tube.

## 2021-06-14 NOTE — Discharge Instructions (Addendum)
The testing today indicates that you do not have a pneumonia but you do have a UTI.  Stop taking the doxycycline that was prescribed earlier today.  Start taking Keflex, tomorrow morning.  Make sure you are getting plenty of rest, and drinking a lot of fluids.  Follow-up with your doctor for further care as needed.

## 2021-06-14 NOTE — ED Notes (Signed)
[facility] called  

## 2021-06-14 NOTE — ED Provider Notes (Signed)
Emergency Department Provider Note   I have reviewed the triage vital signs and the nursing notes.   HISTORY  Chief Complaint Weakness   HPI Sarah Boone is a 88 y.o. female with past medical history reviewed below presents to the emergency department from her nursing facility with altered mental status.  EMS reports she has been treated for pneumonia with doxycycline.  Patient does not recall this specifically.  She denies any chest or abdominal pain.  She gives a somewhat scattered history and thinks that her daughter drove her here.  According to documentation at bedside from the facility they were concerned about her level of confusion and noted some abdominal distention as well.   Level 5 caveat: AMS   Past Medical History:  Diagnosis Date   Anxiety    Arthritis    Atrial fibrillation (HCC)    Chronic respiratory failure (HCC)    Dementia (HCC)    Esophageal stenosis status post PEG placement    Hyperlipidemia    Hypertension    Hyponatremia    Hypothyroidism    Insomnia    Type 2 diabetes mellitus ([place])     Review of Systems  Constitutional: No fever/chills Eyes: No visual changes. ENT: No sore throat. Cardiovascular: Denies chest pain. Respiratory: Denies shortness of breath. Gastrointestinal: No abdominal pain.  No nausea, no vomiting.  No diarrhea.  No constipation. Genitourinary: Negative for dysuria. Musculoskeletal: Negative for back pain. Skin: Negative for rash. Neurological: Negative for headaches, focal weakness or numbness.   ____________________________________________   PHYSICAL EXAM:  VITAL SIGNS: ED Triage Vitals  Enc Vitals Group     BP 06/14/21 1334 (!) 127/58     Pulse Rate 06/14/21 1334 97     Resp 06/14/21 1334 18     Temp 06/14/21 1334 98 F (36.7 C)     Temp Source 06/14/21 1334 Oral     SpO2 06/14/21 1334 100 %   Constitutional: Alert and conversational but confused. No acute distress.  Eyes: Conjunctivae are normal.   Head: Atraumatic. Nose: No congestion/rhinnorhea. 3L [place] O2 in place.  Mouth/Throat: Mucous membranes are moist. Neck: No stridor.   Cardiovascular: Normal rate, regular rhythm. Good peripheral circulation. Grossly normal heart sounds.   Respiratory: Normal respiratory effort.  No retractions. Lungs CTAB. Gastrointestinal: Soft and nontender. No distention.  Musculoskeletal: No lower extremity tenderness nor edema. No gross deformities of extremities. Neurologic:  Normal speech and language. No gross focal neurologic deficits are appreciated.  Skin:  Skin is warm, dry and intact. No rash noted.  ____________________________________________   LABS (all labs ordered are listed, but only abnormal results are displayed)  Labs Reviewed  COMPREHENSIVE METABOLIC PANEL - Abnormal; Notable for the following components:      Result Value   Sodium 134 (*)    Glucose, Bld 137 (*)    BUN 27 (*)    Creatinine, Ser 0.35 (*)    All other components within normal limits  BRAIN NATRIURETIC PEPTIDE - Abnormal; Notable for the following components:   B Natriuretic Peptide 315.7 (*)    All other components within normal limits  AMMONIA - Abnormal; Notable for the following components:   Ammonia 44 (*)    All other components within normal limits  RESP PANEL BY RT-PCR (FLU A&B, COVID) ARPGX2  URINE CULTURE  CBC WITH DIFFERENTIAL/PLATELET  LIPASE, BLOOD  TSH  URINALYSIS, ROUTINE W REFLEX MICROSCOPIC  TROPONIN I (HIGH SENSITIVITY)  TROPONIN I (HIGH SENSITIVITY)   ____________________________________________  EKG  EKG Interpretation  Date/Time:  Wednesday June 14 2021 15:09:23 EST Ventricular Rate:  127 PR Interval:    QRS Duration: 88 QT Interval:  344 QTC Calculation: 499 R Axis:   29 Text Interpretation: Low voltage QRS Possible Anterolateral infarct , age undetermined Inferior injury pattern Consider right ventricular involvement in acute inferior infarct Abnormal ECG No previous  ECGs available Confirmed by Nanda Quinton [phone]) on 06/14/2021 3:13:56 PM       No prior tracing for comparison. Patient with no active CP. Will follow troponin.  ____________________________________________  RADIOLOGY  DG Chest 2 View  Result Date: 06/14/2021 CLINICAL DATA:  Cough. EXAM: CHEST - 2 VIEW COMPARISON:  January 13, 2021. FINDINGS: Stable cardiomediastinal silhouette. No acute pulmonary disease is noted. Old right rib fractures are noted. IMPRESSION: No active cardiopulmonary disease. Electronically Signed   By: Marijo Conception M.D.   On: 06/14/2021 14:16   CT Head Wo Contrast  Result Date: 06/14/2021 CLINICAL DATA:  Mental status change.  Recent pneumonia EXAM: CT HEAD WITHOUT CONTRAST TECHNIQUE: Contiguous axial images were obtained from the base of the skull through the vertex without intravenous contrast. RADIATION DOSE REDUCTION: This exam was performed according to the departmental dose-optimization program which includes automated exposure control, adjustment of the mA and/or kV according to patient size and/or use of iterative reconstruction technique. COMPARISON:  None. FINDINGS: Brain: Generalized atrophy. Hypodensity in the frontal white matter bilaterally is symmetric and appears chronic. Negative for acute infarct, hemorrhage, mass Vascular: Negative for hyperdense vessel Skull: Negative Sinuses/Orbits: Paranasal sinuses clear. Bilateral cataract extraction Other: None IMPRESSION: No acute intracranial abnormality. Atrophy and chronic microvascular ischemic changes in the white matter Electronically Signed   By: Franchot Gallo M.D.   On: 06/14/2021 15:07    ____________________________________________   PROCEDURES  Procedure(s) performed:   Procedures  None  ____________________________________________   INITIAL IMPRESSION / ASSESSMENT AND PLAN / ED COURSE  Pertinent labs & imaging results that were available during my care of the patient were reviewed by me  and considered in my medical decision making (see chart for details).   This patient is Presenting for Evaluation of AMS, which does require a range of treatment options, and is a complaint that involves a high risk of morbidity and mortality.  The Differential Diagnoses includes but is not exclusive to alcohol, illicit or prescription medications, intracranial pathology such as stroke, intracerebral hemorrhage, fever or infectious causes including sepsis, hypoxemia, uremia, trauma, endocrine related disorders such as diabetes, hypoglycemia, thyroid-related diseases, etc.   I did obtain Additional Historical Information from EMS, as the patient is confused.  I decided to review pertinent External Data, and in summary no recent ED visits for similar. No recent visits in the Care Everywhere system either.   Clinical Laboratory Tests Ordered, included CMP without AKI. No electrolyte abnormality. Mild ammonia elevation but without somnolence or other acute change in mental status doubt this is significantly contributing. EKG abnormal but troponin is negative. Family describe heart history from [facility].   Radiologic Tests Ordered, included CT head and CXR. I independently interpreted the images and agree with radiology interpretation.   Cardiac Monitor Tracing which shows A fib.   Social Determinants of Health Risk patient is a non-smoker.   Medical Decision Making: Summary:  Patient presents to the emergency department with altered mental status.  Question of recent pneumonia and per review of her records at bedside looks as if she was started on doxycycline today.  With plan for 1  week of treatment.  Oxygen saturation at 100%.  Patient has no focal neurologic deficits.  She is confused.  I do note a history of dementia in her paperwork at bedside.  She is also listed as a DNR.   Reevaluation with update and discussion with patient and daughters both at bedside and by phone. Labs so far are reassuring.  Patient currently at her mental status baseline per daughters. Some report of pain with G tube flushing this AM apparently. Will flush G tube here.   G tube flushing well without pain. UA pending. Dr. Eulis Foster to follow with plan to discharge pending UA.   ____________________________________________  FINAL CLINICAL IMPRESSION(S) / ED DIAGNOSES  Final diagnoses:  Transient alteration of awareness      Note:  This document was prepared using Dragon voice recognition software and may include unintentional dictation errors.  Nanda Quinton, MD, [facility]    Jakorey Mcconathy, [facility], MD 06/15/21 [phone]

## 2021-06-14 NOTE — ED Notes (Signed)
Pt brief changed. Peri care provided.  

## 2021-06-17 LAB — URINE CULTURE: Culture: 70000 — AB

## 2021-06-18 ENCOUNTER — Telehealth: Payer: Self-pay | Admitting: Emergency Medicine

## 2021-06-18 NOTE — Progress Notes (Signed)
ED Antimicrobial Stewardship Positive Culture Follow Up   Sarah Boone is an 88 y.o. female who presented to [facility] on 06/14/2021 with a chief complaint of  Chief Complaint  Patient presents with   Weakness    Recent Results (from the past 720 hour(s))  Resp Panel by RT-PCR (Flu A&B, Covid) Nasopharyngeal Swab     Status: None   Collection Time: 06/14/21  2:18 PM   Specimen: Nasopharyngeal Swab; Nasopharyngeal(NP) swabs in vial transport medium  Result Value Ref Range Status   SARS Coronavirus 2 by RT PCR NEGATIVE NEGATIVE Final    Comment: (NOTE) SARS-CoV-2 target nucleic acids are NOT DETECTED.  The SARS-CoV-2 RNA is generally detectable in upper respiratory specimens during the acute phase of infection. The lowest concentration of SARS-CoV-2 viral copies this assay can detect is 138 copies/mL. A negative result does not preclude SARS-Cov-2 infection and should not be used as the sole basis for treatment or other patient management decisions. A negative result may occur with  improper specimen collection/handling, submission of specimen other than nasopharyngeal swab, presence of viral mutation(s) within the areas targeted by this assay, and inadequate number of viral copies(<138 copies/mL). A negative result must be combined with clinical observations, patient history, and epidemiological information. The expected result is Negative.  Fact Sheet for Patients:  BloggerCourse.com  Fact Sheet for Healthcare Providers:  SeriousBroker.it  This test is no t yet approved or cleared by the [place] FDA and  has been authorized for detection and/or diagnosis of SARS-CoV-2 by FDA under an Emergency Use Authorization (EUA). This EUA will remain  in effect (meaning this test can be used) for the duration of the COVID-19 declaration under Section 564(b)(1) of the Act, 21 U.S.C.section 360bbb-3(b)(1), unless the authorization is  terminated  or revoked sooner.       Influenza A by PCR NEGATIVE NEGATIVE Final   Influenza B by PCR NEGATIVE NEGATIVE Final    Comment: (NOTE) The Xpert Xpress SARS-CoV-2/FLU/RSV plus assay is intended as an aid in the diagnosis of influenza from Nasopharyngeal swab specimens and should not be used as a sole basis for treatment. Nasal washings and aspirates are unacceptable for Xpert Xpress SARS-CoV-2/FLU/RSV testing.  Fact Sheet for Patients: BloggerCourse.com  Fact Sheet for Healthcare Providers: SeriousBroker.it  This test is not yet approved or cleared by the [place] FDA and has been authorized for detection and/or diagnosis of SARS-CoV-2 by FDA under an Emergency Use Authorization (EUA). This EUA will remain in effect (meaning this test can be used) for the duration of the COVID-19 declaration under Section 564(b)(1) of the Act, 21 U.S.C. section 360bbb-3(b)(1), unless the authorization is terminated or revoked.  Performed at [facility], [address] [place], [place] [address]   Urine Culture     Status: Abnormal   Collection Time: 06/14/21  5:17 PM   Specimen: Urine, Clean Catch  Result Value Ref Range Status   Specimen Description   Final    URINE, CLEAN CATCH Performed at [facility], [address], [place], [place] [address]    Special Requests   Final    NONE Performed at [facility], [address] [place], [place] [address]    Culture 70,000 COLONIES/mL PSEUDOMONAS AERUGINOSA (A)  Final   Report Status 06/17/2021 FINAL  Final   Organism ID, Bacteria PSEUDOMONAS AERUGINOSA (A)  Final      Susceptibility   Pseudomonas aeruginosa - MIC*    CEFTAZIDIME 4  SENSITIVE Sensitive     CIPROFLOXACIN <=0.25 SENSITIVE Sensitive     GENTAMICIN <=1 SENSITIVE Sensitive     IMIPENEM 2 SENSITIVE Sensitive     PIP/TAZO <=4 SENSITIVE Sensitive      CEFEPIME 4 SENSITIVE Sensitive     * 70,000 COLONIES/mL PSEUDOMONAS AERUGINOSA    [x]  Treated with cephalexin, organism resistant to prescribed antimicrobial; no dysuria during ED visit, < 100 K in UCx   Plan: query pt for dysuria symptoms If pt symptomatic: Rx fosfomycin 3 gm po x 1 dose, if no symptoms> no treatment needed  ED Provider: A. T 06/18/2021, 1:16 PM Clinical Pharmacist [phone]

## 2021-06-18 NOTE — Telephone Encounter (Signed)
Post ED Visit - Positive Culture Follow-up: Successful Patient Follow-Up  Culture assessed and recommendations reviewed by:  []  , Pharm.D. []  Enzo Bi, Pharm.D., BCPS AQ-ID []  , Pharm.D., BCPS []  Celedonio Miyamoto, .D., BCPS []  [place], .D., BCPS, AAHIVP []  Georgina Pillion, Pharm.D., BCPS, AAHIVP []  [address], PharmD, BCPS []  , PharmD, BCPS []  [place], PharmD, BCPS [x]  [address], PharmD  Positive urine culture  []  Patient discharged without antimicrobial prescription and treatment is now indicated [x]  Organism is resistant to prescribed ED discharge antimicrobial []  Patient with positive blood cultures  Changes discussed with ED provider: New antibiotic prescription: Fosfomycin three gram x once Called and faxed to [facility] phone: [phone] fax [phone]    06/18/2021, 6:37 PM

## 2021-07-04 ENCOUNTER — Other Ambulatory Visit: Payer: Self-pay

## 2021-07-04 ENCOUNTER — Encounter: Payer: Self-pay | Admitting: Cardiology

## 2021-07-04 ENCOUNTER — Ambulatory Visit (INDEPENDENT_AMBULATORY_CARE_PROVIDER_SITE_OTHER): Payer: Medicare Other | Admitting: Cardiology

## 2021-07-04 VITALS — BP 111/72 | HR 50 | Ht 64.0 in

## 2021-07-04 DIAGNOSIS — F01C4 Vascular dementia, severe, with anxiety: Secondary | ICD-10-CM | POA: Diagnosis not present

## 2021-07-04 DIAGNOSIS — I4821 Permanent atrial fibrillation: Secondary | ICD-10-CM | POA: Diagnosis not present

## 2021-07-04 DIAGNOSIS — R0602 Shortness of breath: Secondary | ICD-10-CM | POA: Insufficient documentation

## 2021-07-04 DIAGNOSIS — I10 Essential (primary) hypertension: Secondary | ICD-10-CM

## 2021-07-04 DIAGNOSIS — Z66 Do not resuscitate: Secondary | ICD-10-CM

## 2021-07-04 NOTE — Progress Notes (Signed)
Cardiology Office Note:    Date:  07/04/2021   ID:  Sarah Boone, DOB 1933/08/25, MRN [id number]  PCP:  Verl Blalock, NP  Cardiologist:  Berniece Salines, DO  Electrophysiologist:  None   Referring MD: Verl Blalock, NP   " I am doing ok"  History of Present Illness:    Sarah Boone is a 88 y.o. female with a hx of atrial fibrillation on rate control agent with Cardizem, deviously on Eliquis but had significant nosebleed and is not on any anticoagulation, diabetes mellitus, hyperlipidemia, hypertension, nonambulatory has been in a wheelchair for several years now is here today to establish cardiac care.  The patient has severe dementia and is a poor historian.  Most of the history was given by her daughter on the phone who lives in [place].  She tells me that patient has had intermittent chest discomfort but there suspected to be associated with her asthma, they are concerned that the patient is not getting her inhalers as prescribed at the facility.  No other complaints at this time.  Past Medical History:  Diagnosis Date   Anxiety    Arthritis    Atrial fibrillation (HCC)    Chronic respiratory failure (HCC)    Dementia (HCC)    Esophageal stenosis status post PEG placement    Hyperlipidemia    Hypertension    Hyponatremia    Hypothyroidism    Insomnia    Type 2 diabetes mellitus ([place])     Past Surgical History:  Procedure Laterality Date   IR PATIENT EVAL TECH 0-60 MINS  03/10/2021   THYROID SURGERY     vaginal polyps excision      Current Medications: Current Meds  Medication Sig   acetaminophen (TYLENOL) 325 MG tablet Take 325 mg by mouth.   benzonatate (TESSALON PERLES) 100 MG capsule Take 1 capsule (100 mg total) by mouth 2 (two) times daily.   CALCIUM + VITAMIN D3 600-10 MG-MCG TABS Take by mouth daily as needed.   cholecalciferol (VITAMIN D) 25 MCG (1000 UNIT) tablet Take 1,000 Units by mouth daily.   cholestyramine (QUESTRAN) 4 GM/DOSE powder Take by  mouth.   diclofenac Sodium (VOLTAREN) 1 % GEL Apply topically.   diltiazem (CARDIZEM) 120 MG tablet Take 120 mg by mouth 2 (two) times daily.   estradiol (ESTRACE) 0.1 MG/GM vaginal cream Place vaginally.   famotidine (PEPCID) 20 MG tablet Take 20 mg by mouth daily.   furosemide (LASIX) 20 MG tablet Take 20 mg by mouth daily.   ipratropium-albuterol (DUONEB) 0.5-2.5 (3) MG/3ML SOLN Take 3 mLs by nebulization every 6 (six) hours as needed (cough, chest tightness).   levothyroxine (SYNTHROID) 175 MCG tablet Take 175 mcg by mouth daily.   Melatonin 5 MG CAPS Take 5 mg by mouth at bedtime.   metoprolol tartrate (LOPRESSOR) 25 MG tablet Take 25 mg by mouth 2 (two) times daily.   nitroGLYCERIN (NITROSTAT) 0.4 MG SL tablet Place under the tongue.   NOVOLOG FLEXPEN 100 UNIT/ML FlexPen Inject into the skin.   NYAMYC powder Apply topically.   pantoprazole sodium (PROTONIX) 40 mg Take by mouth.   polyethylene glycol powder (GLYCOLAX/MIRALAX) 17 GM/SCOOP powder Take 17 g by mouth daily.   Probiotic Product (BACID) CAPS Take by mouth.   QUEtiapine (SEROQUEL) 25 MG tablet Take by mouth.   sertraline (ZOLOFT) 25 MG tablet Take 25 mg by mouth daily.   spironolactone (ALDACTONE) 25 MG tablet Take by mouth.   traMADol (ULTRAM) 50 MG tablet  Take 50 mg by mouth every 6 (six) hours as needed.   traZODone (DESYREL) 50 MG tablet Take 50 mg by mouth at bedtime.   zinc oxide 20 % ointment Apply topically.     Allergies:   Ciprofloxacin, Crestor [rosuvastatin], Hydrocodone, Latex, and Sulfadiazine   Social History   Socioeconomic History   Marital status: Divorced    Spouse name: Not on file   Number of children: Not on file   Years of education: Not on file   Highest education level: Not on file  Occupational History   Not on file  Tobacco Use   Smoking status: Never   Smokeless tobacco: Never  Substance and Sexual Activity   Alcohol use: Not on file   Drug use: Not on file   Sexual activity: Not on  file  Other Topics Concern   Not on file  Social History Narrative   Not on file   Social Determinants of Health   Financial Resource Strain: Not on file  Food Insecurity: Not on file  Transportation Needs: Not on file  Physical Activity: Not on file  Stress: Not on file  Social Connections: Not on file     Family History: The patient's family history is not on file.  ROS:   Review of Systems  Constitution: Negative for decreased appetite, fever and weight gain.  HENT: Negative for congestion, ear discharge, hoarse voice and sore throat.   Eyes: Negative for discharge, redness, vision loss in right eye and visual halos.  Cardiovascular: Negative for chest pain, dyspnea on exertion, leg swelling, orthopnea and palpitations.  Respiratory: Negative for cough, hemoptysis, shortness of breath and snoring.   Endocrine: Negative for heat intolerance and polyphagia.  Hematologic/Lymphatic: Negative for bleeding problem. Does not bruise/bleed easily.  Skin: Negative for flushing, nail changes, rash and suspicious lesions.  Musculoskeletal: Negative for arthritis, joint pain, muscle cramps, myalgias, neck pain and stiffness.  Gastrointestinal: Negative for abdominal pain, bowel incontinence, diarrhea and excessive appetite.  Genitourinary: Negative for decreased libido, genital sores and incomplete emptying.  Neurological: Negative for brief paralysis, focal weakness, headaches and loss of balance.  Psychiatric/Behavioral: Negative for altered mental status, depression and suicidal ideas.  Allergic/Immunologic: Negative for HIV exposure and persistent infections.    EKGs/Labs/Other Studies Reviewed:    The following studies were reviewed today:   EKG:  The ekg ordered today demonstrates   Recent Labs: 06/14/2021: ALT 16; B Natriuretic Peptide 315.7; BUN 27; Creatinine, Ser 0.35; Hemoglobin 13.3; Platelets 225; Potassium 4.0; Sodium 134; TSH 3.628  Recent Lipid Panel No results  found for: CHOL, TRIG, HDL, CHOLHDL, VLDL, LDLCALC, LDLDIRECT  Physical Exam:    VS:  BP 111/72    Pulse (!) 50    Ht 5\' 4"  (1.626 m)    SpO2 98%    BMI 27.46 kg/m     Wt Readings from Last 3 Encounters:  06/14/21 160 lb (72.6 kg)  04/26/21 168 lb 11.2 oz (76.5 kg)  04/03/21 180 lb (81.6 kg)     GEN: Well nourished, well developed in no acute distress HEENT: Normal NECK: No JVD; No carotid bruits LYMPHATICS: No lymphadenopathy CARDIAC: S1S2 noted,RRR, no murmurs, rubs, gallops RESPIRATORY:  Clear to auscultation without rales, wheezing or rhonchi  ABDOMEN: Soft, non-tender, non-distended, +bowel sounds, no guarding. EXTREMITIES: No edema, No cyanosis, no clubbing MUSCULOSKELETAL:  No deformity  SKIN: Warm and dry NEUROLOGIC:  Alert and oriented x 3, non-focal PSYCHIATRIC:  Normal affect, good insight  ASSESSMENT:  1. Permanent atrial fibrillation ([place])   2. SOB (shortness of breath)   3. Primary hypertension   4. Severe vascular dementia with anxiety   5. DNR (do not resuscitate)    PLAN:     She is a atrial fibrillation in the office today with slow ventricular rate.  We will keep her on her current medication regimen.  She has not had echocardiogram she does have some shortness of breath at rest we will get an echocardiogram to assess her LV function.  Coronary chest discomfort suspected to be in the setting of her asthma but we will see if there is any suggestion from the echo.  In the meantime the patient despite having dementia the patient tells me during our visit that she does not want to have any resuscitation measures.  The patient is in agreement with the above plan. The patient left the office in stable condition.  The patient will follow up in 6 months or sooner if needed.   Medication Adjustments/Labs and Tests Ordered: Current medicines are reviewed at length with the patient today.  Concerns regarding medicines are outlined above.  Orders Placed This  Encounter  Procedures   EKG 12-Lead   ECHOCARDIOGRAM COMPLETE   No orders of the defined types were placed in this encounter.   Patient Instructions  Medication Instructions:  Your physician recommends that you continue on your current medications as directed. Please refer to the Current Medication list given to you today.  *If you need a refill on your cardiac medications before your next appointment, please call your pharmacy*   Lab Work: None If you have labs (blood work) drawn today and your tests are completely normal, you will receive your results only by: [place] (if you have MyChart) OR A paper copy in the mail If you have any lab test that is abnormal or we need to change your treatment, we will call you to review the results.   Testing/Procedures: Your physician has requested that you have an echocardiogram. Echocardiography is a painless test that uses sound waves to create images of your heart. It provides your doctor with information about the size and shape of your heart and how well your hearts chambers and valves are working. This procedure takes approximately one hour. There are no restrictions for this procedure.    Follow-Up: At [facility], you and your health needs are our priority.  As part of our continuing mission to provide you with exceptional heart care, we have created designated Provider Care Teams.  These Care Teams include your primary Cardiologist (physician) and Advanced Practice Providers (APPs -  Physician Assistants and Nurse Practitioners) who all work together to provide you with the care you need, when you need it.  We recommend signing up for the patient portal called "MyChart".  Sign up information is provided on this After Visit Summary.  MyChart is used to connect with patients for Virtual Visits (Telemedicine).  Patients are able to view lab/test results, encounter notes, upcoming appointments, etc.  Non-urgent messages can be sent  to your provider as well.   To learn more about what you can do with MyChart, go to [email or web address].    Your next appointment:   6 month(s)  The format for your next appointment:   In Person  Provider:   Berniece Salines, DO     Other Instructions     Adopting a Healthy Lifestyle.  Know what a healthy weight is for you (roughly BMI <25)  and aim to maintain this   Aim for 7+ servings of fruits and vegetables daily   65-80+ fluid ounces of water or unsweet tea for healthy kidneys   Limit to max 1 drink of alcohol per day; avoid smoking/tobacco   Limit animal fats in diet for cholesterol and heart health - choose grass fed whenever available   Avoid highly processed foods, and foods high in saturated/trans fats   Aim for low stress - take time to unwind and care for your mental health   Aim for 150 min of moderate intensity exercise weekly for heart health, and weights twice weekly for bone health   Aim for 7-9 hours of sleep daily   When it comes to diets, agreement about the perfect plan isnt easy to find, even among the experts. Experts at the [employer] developed an idea known as the Healthy Eating Plate. Just imagine a plate divided into logical, healthy portions.   The emphasis is on diet quality:   Load up on vegetables and fruits - one-half of your plate: Aim for color and variety, and remember that potatoes dont count.   Go for whole grains - one-quarter of your plate: Whole wheat, barley, wheat berries, quinoa, oats, brown rice, and foods made with them. If you want pasta, go with whole wheat pasta.   Protein power - one-quarter of your plate: Fish, chicken, beans, and nuts are all healthy, versatile protein sources. Limit red meat.   The diet, however, does go beyond the plate, offering a few other suggestions.   Use healthy plant oils, such as olive, canola, soy, corn, sunflower and peanut. Check the labels, and avoid partially  hydrogenated oil, which have unhealthy trans fats.   If youre thirsty, drink water. Coffee and tea are good in moderation, but skip sugary drinks and limit milk and dairy products to one or two daily servings.   The type of carbohydrate in the diet is more important than the amount. Some sources of carbohydrates, such as vegetables, fruits, whole grains, and beans-are healthier than others.   Finally, stay active  Signed, Berniece Salines, DO  07/04/2021 12:06 PM    [facility]

## 2021-07-04 NOTE — Patient Instructions (Addendum)
Medication Instructions:  Your physician recommends that you continue on your current medications as directed. Please refer to the Current Medication list given to you today.  *If you need a refill on your cardiac medications before your next appointment, please call your pharmacy*   Lab Work: None If you have labs (blood work) drawn today and your tests are completely normal, you will receive your results only by: . MyChart Message (if you have MyChart) OR . A paper copy in the mail If you have any lab test that is abnormal or we need to change your treatment, we will call you to review the results.   Testing/Procedures: Your physician has requested that you have an echocardiogram. Echocardiography is a painless test that uses sound waves to create images of your heart. It provides your doctor with information about the size and shape of your heart and how well your heart's chambers and valves are working. This procedure takes approximately one hour. There are no restrictions for this procedure.     Follow-Up: At [facility], you and your health needs are our priority.  As part of our continuing mission to provide you with exceptional heart care, we have created designated Provider Care Teams.  These Care Teams include your primary Cardiologist (physician) and Advanced Practice Providers (APPs -  Physician Assistants and Nurse Practitioners) who all work together to provide you with the care you need, when you need it.  We recommend signing up for the patient portal called "MyChart".  Sign up information is provided on this After Visit Summary.  MyChart is used to connect with patients for Virtual Visits (Telemedicine).  Patients are able to view lab/test results, encounter notes, upcoming appointments, etc.  Non-urgent messages can be sent to your provider as well.   To learn more about what you can do with MyChart, go to [email or web address].com.    Your next appointment:   6  month(s)  The format for your next appointment:   In Person  Provider:   Kardie Tobb, DO   Other Instructions   

## 2021-07-19 ENCOUNTER — Other Ambulatory Visit: Payer: Self-pay

## 2021-07-19 ENCOUNTER — Ambulatory Visit (HOSPITAL_COMMUNITY)
Admission: RE | Admit: 2021-07-19 | Discharge: 2021-07-19 | Disposition: A | Discharge status: Home / Self Care | Payer: Medicare Other | Source: Ambulatory Visit | Attending: Cardiology | Admitting: Cardiology

## 2021-07-19 DIAGNOSIS — I10 Essential (primary) hypertension: Secondary | ICD-10-CM | POA: Insufficient documentation

## 2021-07-19 DIAGNOSIS — I071 Rheumatic tricuspid insufficiency: Secondary | ICD-10-CM | POA: Diagnosis not present

## 2021-07-19 DIAGNOSIS — I4821 Permanent atrial fibrillation: Secondary | ICD-10-CM | POA: Insufficient documentation

## 2021-07-19 DIAGNOSIS — R0602 Shortness of breath: Secondary | ICD-10-CM | POA: Insufficient documentation

## 2021-07-19 DIAGNOSIS — E119 Type 2 diabetes mellitus without complications: Secondary | ICD-10-CM | POA: Diagnosis not present

## 2021-07-19 LAB — ECHOCARDIOGRAM COMPLETE
AR max vel: 2 cm2
AV Peak grad: 3.1 mmHg
Ao pk vel: 0.88 m/s
Area-P 1/2: 5.23 cm2
Calc EF: 57.3 %
S' Lateral: 2.7 cm
Single Plane A2C EF: 57.9 %
Single Plane A4C EF: 57.6 %

## 2021-08-08 ENCOUNTER — Encounter: Payer: Self-pay | Admitting: Physician Assistant

## 2021-08-23 NOTE — Progress Notes (Signed)
? ? ? ?08/24/2021 ?Sarah Boone ?[id number] ?08/24/1933 ? ?Referring provider: Verl Blalock, NP ?Primary GI doctor: Dr. Luvenia Starch ? ?ASSESSMENT AND PLAN:  ? ?Nausea without vomiting ?We will add on pantoprazole 40 mg once daily ?We will talk with Dr. Havery Moros if potentially patient has gastroparesis and need to decrease the rate of feeding to help with this. ? ?G-tube due to dysphagia/decreased appetite/failure to thrive with history of removal and recent replacement in the ER, cellulitis. ?G-tube is not able to be moved easily within the patient has some mild erythema and granular tissue around it no discharge, no warmth no surrounding erythema. ?Patient does have dementia, states she wants the G-tube, if she continues to pull it out though may need to refer to radiology for further evaluation. ?Question if should refer to interventional radiology sooner than later.   ?Was placed in [place] and will need someone following here. ? ?Severe vascular dementia with anxiety ([place]) ?Comments: ?in SNF, DNR, wheelchair bound ?Daughter provided the majority of the history. ? ?Dysphagia, unspecified type ?Comments: ?status post PEG placement in [place] approximately 10 months ?Most recent upper GI with Dr. Deno Etienne at [facility] showed no stenosis, no dysphagia, some dysmotility otherwise normal. ?Patient still primarily uses PEG tube getting Glucerna 80 mL/h continuously with flushes every 4 hours. ?Patient states she prefers to have the tube in as this makes her feel better, she has decreased appetite and is not taking much in by mouth ? ?Chronic idiopathic constipation ?With likely overflow incontinence due to bedridden status, multiple medications. ?Do MiraLAX 17 g twice daily with Benefiber 1 tablespoon 2-3 times daily, continue Senokot suppository or pills at night as needed. ? ?Elevated alkaline phosphatase level ?Comments: ?08/18/2021 alk phos 200, total bilirubin 0.50, ALT 13, AST 17 White blood cell count 12,  hemoglobin 14.5, platelets 245. ?Patient has slightly elevated alk phos with some dilatation of biliary tract intra and extrahepatic status post cholecystectomy.  Other than alk phos normal labs.  We will get differentiated alkaline phosphatase make sure this is not bone related, GGT and repeat hepatic panel at nursing home. ?If it is elevated can consider MRCP since patient has nausea. ? ?Dilation of biliary tract ?Comments: ?CT abdomen pelvis with contrast while in the ER 08/18/2021: Intra and extrahepatic biliary dilatation without calculi identified status post cholecystectomy. ?Mild elevation of alkaline phosphatase we will get labs further to further evaluate can consider MRCP. ? ?Multiple comorbidities ?Including atrial fibrillation, chronic respiratory failure on oxygen, type 2 diabetes, obesity, bedbound status. ? ?Patient Care Team: ?Sarah Blalock, NP as PCP - General (Adult Health Nurse Practitioner) ?Berniece Salines, DO as PCP - Cardiology (Cardiology) ? ?HISTORY OF PRESENT ILLNESS: ?88 y.o. female with a past medical history of atrial fibrillation rate controlled on Cardizem previously on Eliquis but this was discontinued secondary to nosebleeds, type 2 diabetes, hyperlipidemia, hypertension, dementia daughter lives in [place], history of esophageal stenosis status post PEG placement in [place] approximately 10 months ago (May or June 2022 secondary to food impaction/blockage) and others listed below presents for evaluation of diabetes.  ?Patient long-term skilled nursing facility, [facility], non ambulatory in wheelchair with dementia ?Recently saw cardiology for dyspnea had echocardiogram 07/19/2021 showed ejection fraction 55 to 60% mild to moderate tricuspid regurgitation aortic sclerosis without stenosis ? ?02/16/2021 upper GI with single contrast for possible aspiration Dr. Therisa Boone at [facility] consider for oral feeding showed no frank aspiration moderate esophageal dysmotility no obvious  stricture or ulceration.  Normal stomach and proximal  bowel ? ?Patient was recently seen at [facility] ER on 04/21 for feeding tube dysfunction, had it fall out/pulled out while at [facility]. ?Had 5 French tube replacement while in the ER. ? ?CT abdomen pelvis with contrast while in the ER 08/18/2021 showed percutaneous gastrostomy tube small air fluid collection and subcutaneous tissues around the percutaneous could represent postsurgical changes otherwise abscess cannot be excluded.  Intra and extrahepatic biliary dilatation without calculi identified status post cholecystectomy.  Nonobstructing right renal calculi not no evidence of bowel obstruction.  Prominent amount of stool in distal colon which can be seen with constipation.  No ascites ?Sodium 131, glucose 141, BUN 40, creatinine 0.48, alk phos 200, total bilirubin 0.50, ALT 13, AST 17 White blood cell count 12, hemoglobin 14.5, platelets 245. ? ?She had procedure Tuesday for teeth cleaning and pulled it out.  ?Placed on ABX for cellulitis surrounding G tube, this has improved.  ?Her daughter Sarah Boone is with her, gives history and has 1 other daughter.   ?She has nausea but no AB pain ?She mainly uses G tube for feedings, she has continuous feed, glucerna 80 ml/hour gets flushed a 4 hours with 100 cc an hour.  ?She has anal pain.but has history of hemorrhoids, no blood in stool.  ?Is getting treated for  ? ?Current Medications:  ? ?Current Outpatient Medications (Endocrine & Metabolic):  ?  levothyroxine (SYNTHROID) 175 MCG tablet, Take 175 mcg by mouth daily. ?  NOVOLOG FLEXPEN 100 UNIT/ML FlexPen, Inject into the skin. ? ?Current Outpatient Medications (Cardiovascular):  ?  cholestyramine (QUESTRAN) 4 GM/DOSE powder, Take by mouth. ?  diltiazem (CARDIZEM) 120 MG tablet, Take 120 mg by mouth 2 (two) times daily. ?  furosemide (LASIX) 20 MG tablet, Take 20 mg by mouth daily. ?  metoprolol tartrate (LOPRESSOR) 25 MG tablet, Take 25 mg by mouth 2 (two) times daily. ?   nitroGLYCERIN (NITROSTAT) 0.4 MG SL tablet, Place under the tongue. ?  spironolactone (ALDACTONE) 25 MG tablet, Take by mouth. ? ?Current Outpatient Medications (Respiratory):  ?  benzonatate (TESSALON PERLES) 100 MG capsule, Take 1 capsule (100 mg total) by mouth 2 (two) times daily. ?  ipratropium-albuterol (DUONEB) 0.5-2.5 (3) MG/3ML SOLN, Take 3 mLs by nebulization every 6 (six) hours as needed (cough, chest tightness). ? ?Current Outpatient Medications (Analgesics):  ?  acetaminophen (TYLENOL) 325 MG tablet, Take 325 mg by mouth. ?  ASPIRIN LOW DOSE 81 MG chewable tablet, Chew 81 mg by mouth daily. (Patient not taking: Reported on 07/04/2021) ?  traMADol (ULTRAM) 50 MG tablet, Take 50 mg by mouth every 6 (six) hours as needed. ? ? ?Current Outpatient Medications (Other):  ?  bisacodyl (DULCOLAX) 10 MG suppository, Place 10 mg rectally daily. (Patient not taking: Reported on 07/04/2021) ?  CALCIUM + VITAMIN D3 600-10 MG-MCG TABS, Take by mouth daily as needed. ?  cephALEXin (KEFLEX) 250 MG capsule, Take 1 capsule (250 mg total) by mouth 4 (four) times daily. (Patient not taking: Reported on 07/04/2021) ?  cholecalciferol (VITAMIN D) 25 MCG (1000 UNIT) tablet, Take 1,000 Units by mouth daily. ?  diclofenac Sodium (VOLTAREN) 1 % GEL, Apply topically. ?  estradiol (ESTRACE) 0.1 MG/GM vaginal cream, Place vaginally. ?  famotidine (PEPCID) 20 MG tablet, Take 20 mg by mouth daily. ?  gabapentin (NEURONTIN) 250 MG/5ML solution, Take by mouth. (Patient not taking: Reported on 07/04/2021) ?  linezolid (ZYVOX) 600 MG tablet, Take 600 mg by mouth 2 (two) times daily. (Patient not taking: Reported on  07/04/2021) ?  LORazepam (ATIVAN) 0.5 MG tablet, Take 0.5 mg by mouth daily as needed. (Patient not taking: Reported on 07/04/2021) ?  Melatonin 5 MG CAPS, Take 5 mg by mouth at bedtime. ?  NYAMYC powder, Apply topically. ?  OXcarbazepine (TRILEPTAL) 150 MG tablet, Take 150 mg by mouth 2 (two) times daily. (Patient not taking: Reported on  07/04/2021) ?  pantoprazole sodium (PROTONIX) 40 mg, Take by mouth. ?  polyethylene glycol powder (GLYCOLAX/MIRALAX) 17 GM/SCOOP powder, Take 17 g by mouth daily. ?  Probiotic Product (BACID) CAPS, Take by mouth.

## 2021-08-24 ENCOUNTER — Encounter: Payer: Self-pay | Admitting: Physician Assistant

## 2021-08-24 ENCOUNTER — Ambulatory Visit (INDEPENDENT_AMBULATORY_CARE_PROVIDER_SITE_OTHER): Payer: Medicare Other | Admitting: Physician Assistant

## 2021-08-24 VITALS — HR 56

## 2021-08-24 DIAGNOSIS — R1310 Dysphagia, unspecified: Secondary | ICD-10-CM

## 2021-08-24 DIAGNOSIS — F01C4 Vascular dementia, severe, with anxiety: Secondary | ICD-10-CM | POA: Diagnosis not present

## 2021-08-24 DIAGNOSIS — R748 Abnormal levels of other serum enzymes: Secondary | ICD-10-CM

## 2021-08-24 DIAGNOSIS — K5904 Chronic idiopathic constipation: Secondary | ICD-10-CM | POA: Diagnosis not present

## 2021-08-24 DIAGNOSIS — K838 Other specified diseases of biliary tract: Secondary | ICD-10-CM

## 2021-08-24 NOTE — Patient Instructions (Addendum)
Add on miralax 17 gram twice a day ?Add on benefiber 2-3 x a day ?If that does not help can do enemas/suppositories or add on senokot at night.  ? ?Please add on pantoprazole 40 mg once daily for nausea ? ?I believe your loose stools may be due to something called overflow diarrhea. ?If you predominantly have constipation, straining or incomplete bowel movements at times the only thing to get through the large amounts of stool is liquid. ?This can cause someone to feel like they are having diarrhea yet actually they have too much backup of stool. ?We can evaluate this with a rectal exam, and x-ray of your abdomen, or CT scan. ?If this is the case usually doing a combination of Benefiber 1-2 times daily with MiraLAX 17 g daily can be helpful. ?At times there are medications that can also be helpful for this but generally I start with over-the-counter ?I also like somebody to get a squatty potty to help straighten out the colon whether having a bowel movement. ?Sometimes we will schedule for pelvic floor physical therapy if you are having incomplete bowel movements and failure pelvic floor is weak and contributing to the constipation. ? ?Benefiber or Citracel is good for constipation/diarrhea/irritable bowel syndrome, it helps with weight loss and can help lower your bad cholesterol. Please do 1 TBSP in the morning in water, coffee, or tea up to twice a day. It can take up to a month before you can see a difference with your bowel movements. It is cheapest from [employer], [employer], [employer].  ? ?Miralax is an osmotic laxative.  ?It only brings more water into the stool.  ?This is safe to take daily.  ?Can take up to 17 gram of miralax twice a day.  ?Mix with juice or coffee.  ?Start 1 capful at night for 3-4 days and reassess your response in 3-4 days.  ?You can increase and decrease the dose based on your response.  ?Remember, it can take up to 3-4 days to take effect OR for the effects to wear off.  ? ?I often pair this  with benefiber in the morning to help assure the stool is not too loose.  ? ? ?If you are age 86 or older, your body mass index should be between 23-30. Your There is no height or weight on file to calculate BMI. If this is out of the aforementioned range listed, please consider follow up with your Primary Care Provider. ? ?If you are age 50 or younger, your body mass index should be between 19-25. Your There is no height or weight on file to calculate BMI. If this is out of the aformentioned range listed, please consider follow up with your Primary Care Provider.  ? ?________________________________________________________ ? ?The [place] [facility] providers would like to encourage you to use [facility] to communicate with providers for non-urgent requests or questions.  Due to long hold times on the telephone, sending your provider a message by [facility] may be a faster and more efficient way to get a response.  Please allow 48 business hours for a response.  Please remember that this is for non-urgent requests.  ?_______________________________________________________  ? ?Thank you for choosing [facility] ? ?Quentin Mulling, PA-C  ?

## 2021-08-24 NOTE — Progress Notes (Signed)
Agree with assessment as outlined. ?Complicated situation.  Polypharmacy, not sure if that is related to her nausea.  Would rather give Zofran upfront if she has not tried that yet, can give it a few times daily to see if that will help with her nausea.  If it persist we can consider empiric Reglan. ?We do not manage PEG tubes in our clinic, we have no replacement supplies should there be an issue with this, she will need to go to IR for replacement if she pulls it etc.  If it is functioning okay without any problems right now I do not feel strongly that she needs to see them now however if problems flushing the tube or it becomes misplaced etc. she will need to see them. ?

## 2021-09-04 DIAGNOSIS — K9423 Gastrostomy malfunction: Secondary | ICD-10-CM | POA: Insufficient documentation

## 2021-09-04 DIAGNOSIS — E1165 Type 2 diabetes mellitus with hyperglycemia: Secondary | ICD-10-CM | POA: Insufficient documentation

## 2021-09-04 DIAGNOSIS — N390 Urinary tract infection, site not specified: Secondary | ICD-10-CM | POA: Insufficient documentation

## 2021-09-05 DIAGNOSIS — F0390 Unspecified dementia without behavioral disturbance: Secondary | ICD-10-CM | POA: Insufficient documentation

## 2021-09-05 DIAGNOSIS — R1319 Other dysphagia: Secondary | ICD-10-CM | POA: Insufficient documentation

## 2021-10-27 ENCOUNTER — Ambulatory Visit: Payer: Medicare Other | Admitting: Gastroenterology

## 2021-10-27 DIAGNOSIS — N819 Female genital prolapse, unspecified: Secondary | ICD-10-CM | POA: Insufficient documentation

## 2021-10-27 DIAGNOSIS — L89159 Pressure ulcer of sacral region, unspecified stage: Secondary | ICD-10-CM | POA: Insufficient documentation

## 2021-10-27 DIAGNOSIS — Z7401 Bed confinement status: Secondary | ICD-10-CM | POA: Insufficient documentation

## 2021-10-27 DIAGNOSIS — Z931 Gastrostomy status: Secondary | ICD-10-CM | POA: Insufficient documentation

## 2021-10-27 DIAGNOSIS — R32 Unspecified urinary incontinence: Secondary | ICD-10-CM | POA: Insufficient documentation

## 2021-10-27 DIAGNOSIS — R159 Full incontinence of feces: Secondary | ICD-10-CM | POA: Insufficient documentation

## 2021-10-27 DIAGNOSIS — Z978 Presence of other specified devices: Secondary | ICD-10-CM | POA: Insufficient documentation

## 2021-10-27 DIAGNOSIS — R262 Difficulty in walking, not elsewhere classified: Secondary | ICD-10-CM | POA: Insufficient documentation

## 2022-01-03 ENCOUNTER — Ambulatory Visit: Payer: Medicare Other | Admitting: Gastroenterology

## 2022-01-03 NOTE — Progress Notes (Deleted)
HPI :  88 y.o. female with a past medical history of atrial fibrillation rate controlled on Cardizem previously on Eliquis but this was discontinued secondary to nosebleeds, type 2 diabetes, hyperlipidemia, hypertension, dementia daughter lives in [place], history of esophageal stenosis status post PEG placement in [place] approximately 10 months ago (May or June 2022 secondary to food impaction/blockage) and others listed below presents for evaluation of diabetes.  Patient long-term skilled nursing facility, [facility], non ambulatory in wheelchair with dementia Recently saw cardiology for dyspnea had echocardiogram 07/19/2021 showed ejection fraction 55 to 60% mild to moderate tricuspid regurgitation aortic sclerosis without stenosis   02/16/2021 upper GI with single contrast for possible aspiration Dr. Marca Ancona at [place] consider for oral feeding showed no frank aspiration moderate esophageal dysmotility no obvious stricture or ulceration.  Normal stomach and proximal bowel   Patient was recently seen at [facility] ER on 04/21 for feeding tube dysfunction, had it fall out/pulled out while at [facility]. Had 36 French tube replacement while in the ER.   CT abdomen pelvis with contrast while in the ER 08/18/2021 showed percutaneous gastrostomy tube small air fluid collection and subcutaneous tissues around the percutaneous could represent postsurgical changes otherwise abscess cannot be excluded.  Intra and extrahepatic biliary dilatation without calculi identified status post cholecystectomy.  Nonobstructing right renal calculi not no evidence of bowel obstruction.  Prominent amount of stool in distal colon which can be seen with constipation.  No ascites Sodium 131, glucose 141, BUN 40, creatinine 0.48, alk phos 200, total bilirubin 0.50, ALT 13, AST 17 White blood cell count 12, hemoglobin 14.5, platelets 245.   She had procedure Tuesday for teeth cleaning and pulled it out.  Placed on ABX for  cellulitis surrounding G tube, this has improved.  Her daughter Darl Pikes is with her, gives history and has 1 other daughter.   She has nausea but no AB pain She mainly uses G tube for feedings, she has continuous feed, glucerna 80 ml/hour gets flushed a 4 hours with 100 cc an hour.  She has anal pain.but has history of hemorrhoids, no blood in stool.  Is getting treated for   Nausea without vomiting We will add on pantoprazole 40 mg once daily We will talk with Dr. Adela Lank if potentially patient has gastroparesis and need to decrease the rate of feeding to help with this.   G-tube due to dysphagia/decreased appetite/failure to thrive with history of removal and recent replacement in the ER, cellulitis. G-tube is not able to be moved easily within the patient has some mild erythema and granular tissue around it no discharge, no warmth no surrounding erythema. Patient does have dementia, states she wants the G-tube, if she continues to pull it out though may need to refer to radiology for further evaluation. Question if should refer to interventional radiology sooner than later.   Was placed in [place] and will need someone following here.   Severe vascular dementia with anxiety [facility]) Comments: in SNF, DNR, wheelchair bound Daughter provided the majority of the history.   Dysphagia, unspecified type Comments: status post PEG placement in [place] approximately 10 months Most recent upper GI with Dr. Pati Gallo at [facility] showed no stenosis, no dysphagia, some dysmotility otherwise normal. Patient still primarily uses PEG tube getting Glucerna 80 mL/h continuously with flushes every 4 hours. Patient states she prefers to have the tube in as this makes her feel better, she has decreased appetite and is not taking much in by mouth  Chronic idiopathic constipation With likely overflow incontinence due to bedridden status, multiple medications. Do MiraLAX 17 g twice daily with Benefiber 1  tablespoon 2-3 times daily, continue Senokot suppository or pills at night as needed.   Elevated alkaline phosphatase level Comments: 08/18/2021 alk phos 200, total bilirubin 0.50, ALT 13, AST 17 White blood cell count 12, hemoglobin 14.5, platelets 245. Patient has slightly elevated alk phos with some dilatation of biliary tract intra and extrahepatic status post cholecystectomy.  Other than alk phos normal labs.  We will get differentiated alkaline phosphatase make sure this is not bone related, GGT and repeat hepatic panel at nursing home. If it is elevated can consider MRCP since patient has nausea.   Dilation of biliary tract Comments: CT abdomen pelvis with contrast while in the ER 08/18/2021: Intra and extrahepatic biliary dilatation without calculi identified status post cholecystectomy. Mild elevation of alkaline phosphatase we will get labs further to further evaluate can consider MRCP.   Multiple comorbidities Including atrial fibrillation, chronic respiratory failure on oxygen, type 2 diabetes, obesity, bedbound status.   Agree with assessment as outlined. Complicated situation.  Polypharmacy, not sure if that is related to her nausea.  Would rather give Zofran upfront if she has not tried that yet, can give it a few times daily to see if that will help with her nausea.  If it persist we can consider empiric Reglan. We do not manage PEG tubes in our clinic, we have no replacement supplies should there be an issue with this, she will need to go to IR for replacement if she pulls it etc.  If it is functioning okay without any problems right now I do not feel strongly that she needs to see them now however if problems flushing the tube or it becomes misplaced etc. she will need to see them.   5 visits in May to ED for PEG issues     Past Medical History:  Diagnosis Date   Anxiety    Arthritis    Atrial fibrillation ([place])    Chronic respiratory failure ([facility])    Constipation     Dementia ([facility])    Esophageal stenosis status post PEG placement    Gastritis    Hiatal hernia    Hyperlipidemia    Hypertension    Hyponatremia    Hypothyroidism    Insomnia    Oxygen dependent    Type 2 diabetes mellitus ([place])      Past Surgical History:  Procedure Laterality Date   ABDOMINAL HYSTERECTOMY     APPENDECTOMY     CHOLECYSTECTOMY     IR PATIENT EVAL TECH 0-60 MINS  03/10/2021   THYROID SURGERY     TONSILLECTOMY     vaginal polyps excision     Family History  Problem Relation Age of Onset   Angina Mother    Heart disease Father    Diabetes Father    Diabetes Sister    Diabetes Brother    Fibromyalgia Daughter    Diabetes Daughter    Arthritis Daughter    Irritable bowel syndrome Daughter    Hypertension Daughter    Diabetes Daughter    Social History   Tobacco Use   Smoking status: Never   Smokeless tobacco: Never  Vaping Use   Vaping Use: Never used  Substance Use Topics   Alcohol use: Not Currently   Drug use: Never   Current Outpatient Medications  Medication Sig Dispense Refill   acetaminophen (TYLENOL) 325 MG  tablet Take 325 mg by mouth.     ASPIRIN LOW DOSE 81 MG chewable tablet Chew 81 mg by mouth daily. (Patient not taking: Reported on 07/04/2021)     benzonatate (TESSALON PERLES) 100 MG capsule Take 1 capsule (100 mg total) by mouth 2 (two) times daily. 60 capsule 6   bisacodyl (DULCOLAX) 10 MG suppository Place 10 mg rectally daily. (Patient not taking: Reported on 07/04/2021)     CALCIUM + VITAMIN D3 600-10 MG-MCG TABS Take by mouth daily as needed.     cephALEXin (KEFLEX) 250 MG capsule Take 1 capsule (250 mg total) by mouth 4 (four) times daily. (Patient not taking: Reported on 07/04/2021) 28 capsule 0   cholecalciferol (VITAMIN D) 25 MCG (1000 UNIT) tablet Take 1,000 Units by mouth daily.     cholestyramine (QUESTRAN) 4 GM/DOSE powder Take by mouth.     diclofenac Sodium (VOLTAREN) 1 % GEL Apply topically.     diltiazem (CARDIZEM) 120  MG tablet Take 120 mg by mouth 2 (two) times daily.     estradiol (ESTRACE) 0.1 MG/GM vaginal cream Place vaginally.     famotidine (PEPCID) 20 MG tablet Take 20 mg by mouth daily.     furosemide (LASIX) 20 MG tablet Take 20 mg by mouth daily.     gabapentin (NEURONTIN) 250 MG/5ML solution Take by mouth. (Patient not taking: Reported on 07/04/2021)     ipratropium-albuterol (DUONEB) 0.5-2.5 (3) MG/3ML SOLN Take 3 mLs by nebulization every 6 (six) hours as needed (cough, chest tightness). 360 mL 6   levothyroxine (SYNTHROID) 175 MCG tablet Take 175 mcg by mouth daily.     linezolid (ZYVOX) 600 MG tablet Take 600 mg by mouth 2 (two) times daily. (Patient not taking: Reported on 07/04/2021)     LORazepam (ATIVAN) 0.5 MG tablet Take 0.5 mg by mouth daily as needed. (Patient not taking: Reported on 07/04/2021)     Melatonin 5 MG CAPS Take 5 mg by mouth at bedtime.     metoprolol tartrate (LOPRESSOR) 25 MG tablet Take 25 mg by mouth 2 (two) times daily.     nitroGLYCERIN (NITROSTAT) 0.4 MG SL tablet Place under the tongue.     NOVOLOG FLEXPEN 100 UNIT/ML FlexPen Inject into the skin.     NYAMYC powder Apply topically.     OXcarbazepine (TRILEPTAL) 150 MG tablet Take 150 mg by mouth 2 (two) times daily. (Patient not taking: Reported on 07/04/2021)     pantoprazole sodium (PROTONIX) 40 mg Take by mouth.     polyethylene glycol powder (GLYCOLAX/MIRALAX) 17 GM/SCOOP powder Take 17 g by mouth daily.     Probiotic Product (BACID) CAPS Take by mouth.     QUEtiapine (SEROQUEL) 25 MG tablet Take by mouth.     sertraline (ZOLOFT) 25 MG tablet Take 25 mg by mouth daily.     spironolactone (ALDACTONE) 25 MG tablet Take by mouth.     traMADol (ULTRAM) 50 MG tablet Take 50 mg by mouth every 6 (six) hours as needed.     traZODone (DESYREL) 50 MG tablet Take 50 mg by mouth at bedtime.     zinc oxide 20 % ointment Apply topically.     No current facility-administered medications for this visit.   Allergies  Allergen  Reactions   Ciprofloxacin     Generic    Crestor [Rosuvastatin]    Hydrocodone    Latex    Sulfadiazine      Review of Systems: All systems reviewed and negative  except where noted in HPI.    No results found.  Physical Exam: There were no vitals taken for this visit. Constitutional: Pleasant,well-developed, ***female in no acute distress. HEENT: Normocephalic and atraumatic. Conjunctivae are normal. No scleral icterus. Neck supple.  Cardiovascular: Normal rate, regular rhythm.  Pulmonary/chest: Effort normal and breath sounds normal. No wheezing, rales or rhonchi. Abdominal: Soft, nondistended, nontender. Bowel sounds active throughout. There are no masses palpable. No hepatomegaly. Extremities: no edema Lymphadenopathy: No cervical adenopathy noted. Neurological: Alert and oriented to person place and time. Skin: Skin is warm and dry. No rashes noted. Psychiatric: Normal mood and affect. Behavior is normal.   ASSESSMENT: 88 y.o. female here for assessment of the following  No diagnosis found.  PLAN:   Verl Blalock, NP

## 2022-03-28 ENCOUNTER — Ambulatory Visit: Payer: Medicare Other | Admitting: Obstetrics and Gynecology

## 2022-04-02 NOTE — Progress Notes (Unsigned)
04/02/2022 Sarah Boone [id number] 08-21-1933  Referring provider: Verl Blalock, NP Primary GI doctor: Dr. Luvenia Boone  ASSESSMENT AND PLAN:   Nausea without vomiting We will add on pantoprazole 40 mg once daily We will talk with Dr. Havery Boone if potentially patient has gastroparesis and need to decrease the rate of feeding to help with this.  G-tube due to dysphagia/decreased appetite/failure to thrive with history of removal and recent replacement in the ER, cellulitis. G-tube is not able to be moved easily within the patient has some mild erythema and granular tissue around it no discharge, no warmth no surrounding erythema. Patient does have dementia, states she wants the G-tube, if she continues to pull it out though may need to refer to radiology for further evaluation. Question if should refer to interventional radiology sooner than later.   Was placed in [place] and will need someone following here.  Severe vascular dementia with anxiety ([place]) in SNF, DNR, wheelchair bound Daughter provided the majority of the history.  Dysphagia, unspecified type status post PEG placement in [place] approximately 10 months Most recent upper GI with Dr. Deno Boone at [place] [facility] showed no stenosis, no dysphagia, some dysmotility otherwise normal. Patient still primarily uses PEG tube getting Glucerna 80 mL/h continuously with flushes every 4 hours. Patient states she prefers to have the tube in as this makes her feel better, she has decreased appetite and is not taking much in by mouth  Chronic idiopathic constipation With likely overflow incontinence due to bedridden status, multiple medications. Do MiraLAX 17 g twice daily with Benefiber 1 tablespoon 2-3 times daily, continue Senokot suppository or pills at night as needed.  Elevated alkaline phosphatase level 08/18/2021 alk phos 200, total bilirubin 0.50, ALT 13, AST 17 White blood cell count 12, hemoglobin 14.5, platelets  245. Patient has slightly elevated alk phos with some dilatation of biliary tract intra and extrahepatic status post cholecystectomy.  Other than alk phos normal labs.  We will get differentiated alkaline phosphatase make sure this is not bone related, GGT and repeat hepatic panel at nursing home. If it is elevated can consider MRCP since patient has nausea.  Dilation of biliary tract CT abdomen pelvis with contrast while in the ER 08/18/2021: Intra and extrahepatic biliary dilatation without calculi identified status post cholecystectomy. Mild elevation of alkaline phosphatase we will get labs further to further evaluate can consider MRCP.  Multiple comorbidities Including atrial fibrillation, chronic respiratory failure on oxygen, type 2 diabetes, obesity, bedbound status.  Patient Care Team: Sarah Blalock, NP as PCP - General (Adult Health Nurse Practitioner) Sarah Salines, DO as PCP - Cardiology (Cardiology)  HISTORY OF PRESENT ILLNESS: 88 y.o. female with a past medical history of atrial fibrillation rate controlled on Cardizem previously on Eliquis but this was discontinued secondary to nosebleeds, type 2 diabetes, hyperlipidemia, hypertension, dementia daughter lives in [place], history of esophageal stenosis status post PEG placement in [place] approximately 10 months ago (May or June 2022 secondary to food impaction/blockage) presents for follow-up.  Patient long-term skilled nursing facility, [facility], non ambulatory in wheelchair with dementia Recently saw cardiology for dyspnea had echocardiogram 07/19/2021 showed ejection fraction 55 to 60% mild to moderate tricuspid regurgitation aortic sclerosis without stenosis  02/16/2021 upper GI with single contrast for possible aspiration Dr. Therisa Boone at [place] consider for oral feeding showed no frank aspiration moderate esophageal dysmotility no obvious stricture or ulceration.  Normal stomach and proximal bowel  Patient was  recently seen at [facility] ER on  04/21 for feeding tube dysfunction, had it fall out/pulled out while at [facility].  She was seen again 508, 509, 511, 516, 525 and most recently 10/14 for feeding tube issues. 02/08/2022 patient did seen [facility] to interventional radiology 24 French push cauterize granulation tissue tighten the bumper.  CT abdomen pelvis with contrast while in the ER 08/18/2021 showed percutaneous gastrostomy tube small air fluid collection and subcutaneous tissues around the percutaneous could represent postsurgical changes otherwise abscess cannot be excluded.  Intra and extrahepatic biliary dilatation without calculi identified status post cholecystectomy.  Nonobstructing right renal calculi not no evidence of bowel obstruction.  Prominent amount of stool in distal colon which can be seen with constipation.  No ascites Sodium 131, glucose 141, BUN 40, creatinine 0.48, alk phos 200, total bilirubin 0.50, ALT 13, AST 17 White blood cell count 12, hemoglobin 14.5, platelets 245.  She had procedure Tuesday for teeth cleaning and pulled it out.  Placed on ABX for cellulitis surrounding G tube, this has improved.  Her daughter Sarah Boone is with her, gives history and has 1 other daughter.   She has nausea but no AB pain She mainly uses G tube for feedings, she has continuous feed, glucerna 80 ml/hour gets flushed a 4 hours with 100 cc an hour.  She has anal pain.but has history of hemorrhoids, no blood in stool.  Is getting treated for   Current Medications:   Current Outpatient Medications (Endocrine & Metabolic):    levothyroxine (SYNTHROID) 175 MCG tablet, Take 175 mcg by mouth daily.   NOVOLOG FLEXPEN 100 UNIT/ML FlexPen, Inject into the skin.  Current Outpatient Medications (Cardiovascular):    cholestyramine (QUESTRAN) 4 GM/DOSE powder, Take by mouth.   diltiazem (CARDIZEM) 120 MG tablet, Take 120 mg by mouth 2 (two) times daily.   furosemide (LASIX) 20 MG tablet, Take 20 mg by mouth  daily.   metoprolol tartrate (LOPRESSOR) 25 MG tablet, Take 25 mg by mouth 2 (two) times daily.   nitroGLYCERIN (NITROSTAT) 0.4 MG SL tablet, Place under the tongue.   spironolactone (ALDACTONE) 25 MG tablet, Take by mouth.  Current Outpatient Medications (Respiratory):    benzonatate (TESSALON PERLES) 100 MG capsule, Take 1 capsule (100 mg total) by mouth 2 (two) times daily.   ipratropium-albuterol (DUONEB) 0.5-2.5 (3) MG/3ML SOLN, Take 3 mLs by nebulization every 6 (six) hours as needed (cough, chest tightness).  Current Outpatient Medications (Analgesics):    acetaminophen (TYLENOL) 325 MG tablet, Take 325 mg by mouth.   ASPIRIN LOW DOSE 81 MG chewable tablet, Chew 81 mg by mouth daily. (Patient not taking: Reported on 07/04/2021)   traMADol (ULTRAM) 50 MG tablet, Take 50 mg by mouth every 6 (six) hours as needed.   Current Outpatient Medications (Other):    bisacodyl (DULCOLAX) 10 MG suppository, Place 10 mg rectally daily. (Patient not taking: Reported on 07/04/2021)   CALCIUM + VITAMIN D3 600-10 MG-MCG TABS, Take by mouth daily as needed.   cephALEXin (KEFLEX) 250 MG capsule, Take 1 capsule (250 mg total) by mouth 4 (four) times daily. (Patient not taking: Reported on 07/04/2021)   cholecalciferol (VITAMIN D) 25 MCG (1000 UNIT) tablet, Take 1,000 Units by mouth daily.   diclofenac Sodium (VOLTAREN) 1 % GEL, Apply topically.   estradiol (ESTRACE) 0.1 MG/GM vaginal cream, Place vaginally.   famotidine (PEPCID) 20 MG tablet, Take 20 mg by mouth daily.   gabapentin (NEURONTIN) 250 MG/5ML solution, Take by mouth. (Patient not taking: Reported on 07/04/2021)   linezolid (ZYVOX) 600  MG tablet, Take 600 mg by mouth 2 (two) times daily. (Patient not taking: Reported on 07/04/2021)   LORazepam (ATIVAN) 0.5 MG tablet, Take 0.5 mg by mouth daily as needed. (Patient not taking: Reported on 07/04/2021)   Melatonin 5 MG CAPS, Take 5 mg by mouth at bedtime.   NYAMYC powder, Apply topically.   OXcarbazepine  (TRILEPTAL) 150 MG tablet, Take 150 mg by mouth 2 (two) times daily. (Patient not taking: Reported on 07/04/2021)   pantoprazole sodium (PROTONIX) 40 mg, Take by mouth.   polyethylene glycol powder (GLYCOLAX/MIRALAX) 17 GM/SCOOP powder, Take 17 g by mouth daily.   Probiotic Product (BACID) CAPS, Take by mouth.   QUEtiapine (SEROQUEL) 25 MG tablet, Take by mouth.   sertraline (ZOLOFT) 25 MG tablet, Take 25 mg by mouth daily.   traZODone (DESYREL) 50 MG tablet, Take 50 mg by mouth at bedtime.   zinc oxide 20 % ointment, Apply topically.  Medical History:  Past Medical History:  Diagnosis Date   Anxiety    Arthritis    Atrial fibrillation (HCC)    Chronic respiratory failure (HCC)    Constipation    Dementia (HCC)    Esophageal stenosis status post PEG placement    Gastritis    Hiatal hernia    Hyperlipidemia    Hypertension    Hyponatremia    Hypothyroidism    Insomnia    Oxygen dependent    Type 2 diabetes mellitus ([place])    Allergies:  Allergies  Allergen Reactions   Ciprofloxacin     Generic    Crestor [Rosuvastatin]    Hydrocodone    Latex    Sulfadiazine      Surgical History:  She  has a past surgical history that includes Thyroid surgery; vaginal polyps excision; IR PATIENT EVAL TECH 0-60 MINS (03/10/2021); Appendectomy; Cholecystectomy; Abdominal hysterectomy; and Tonsillectomy. Family History:  Her family history includes Angina in her mother; Arthritis in her daughter; Diabetes in her brother, daughter, daughter, father, and sister; Fibromyalgia in her daughter; Heart disease in her father; Hypertension in her daughter; Irritable bowel syndrome in her daughter. Social History:   reports that she has never smoked. She has never used smokeless tobacco. She reports that she does not currently use alcohol. She reports that she does not use drugs.  REVIEW OF SYSTEMS  : All other systems reviewed and negative except where noted in the History of Present  Illness.   PHYSICAL EXAM: There were no vitals taken for this visit. General:   Obese elderly chronically ill-appearing female lying in recliner wheelchair Eyes:  sclerae anicteric,conjunctive pink  Heart:   Distant heart sounds, regular rate and rhythm, systolic murmur Pulm: Nasal cannula in place, diffuse decreased breath sounds with mild diffuse expiratory wheezing Abdomen:   Soft, Obese AB, Sluggish bowel sounds. No tenderness . Without guarding and Without rebound, No organomegaly appreciated.  Patient has upper left G-tube, this is not easily rotated or manipulated very tight, looking under dressing patient has very mild erythema with some granulation right at the ostomy site, no discharge, no purulence, erythema, warmth surrounding the area. Extremities:  With edema Msk: Not assessed, patient in wheelchair. Neurologic: No focal deficits.  Psychiatric:  Cooperative. Normal mood and affect.    Vladimir Crofts, PA-C 1:08 PM

## 2022-04-04 ENCOUNTER — Encounter: Payer: Self-pay | Admitting: Physician Assistant

## 2022-04-04 ENCOUNTER — Ambulatory Visit (INDEPENDENT_AMBULATORY_CARE_PROVIDER_SITE_OTHER): Payer: Medicare Other | Admitting: Physician Assistant

## 2022-04-04 DIAGNOSIS — F03C0 Unspecified dementia, severe, without behavioral disturbance, psychotic disturbance, mood disturbance, and anxiety: Secondary | ICD-10-CM

## 2022-04-04 DIAGNOSIS — Z66 Do not resuscitate: Secondary | ICD-10-CM | POA: Diagnosis not present

## 2022-04-04 DIAGNOSIS — R159 Full incontinence of feces: Secondary | ICD-10-CM | POA: Diagnosis not present

## 2022-04-04 DIAGNOSIS — L89150 Pressure ulcer of sacral region, unstageable: Secondary | ICD-10-CM

## 2022-04-04 DIAGNOSIS — K9423 Gastrostomy malfunction: Secondary | ICD-10-CM

## 2022-04-04 NOTE — Patient Instructions (Signed)
If you are age 86 or older, your body mass index should be between 23-30. Your There is no height or weight on file to calculate BMI. If this is out of the aforementioned range listed, please consider follow up with your Primary Care Provider.  If you are age 64 or younger, your body mass index should be between 19-25. Your There is no height or weight on file to calculate BMI. If this is out of the aformentioned range listed, please consider follow up with your Primary Care Provider.   __________________________________________________________  The [facility] providers would like to encourage you to use MYCHART to communicate with providers for non-urgent requests or questions.  Due to long hold times on the telephone, sending your provider a message by MYCHART may be a faster and more efficient way to get a response.  Please allow 48 business hours for a response.  Please remember that this is for non-urgent requests.    It was a pleasure to see you today!  Thank you for trusting me with your gastrointestinal care!     

## 2022-04-04 NOTE — Progress Notes (Signed)
Agree with assessment and plan as outlined.  

## 2022-04-08 ENCOUNTER — Emergency Department (HOSPITAL_COMMUNITY): Payer: Medicare Other

## 2022-04-08 ENCOUNTER — Other Ambulatory Visit: Payer: Self-pay

## 2022-04-08 ENCOUNTER — Emergency Department (HOSPITAL_COMMUNITY)
Admission: EM | Admit: 2022-04-08 | Discharge: 2022-04-08 | Disposition: A | Discharge status: Skilled Nursing Facility | Payer: Medicare Other | Attending: Emergency Medicine | Admitting: Emergency Medicine

## 2022-04-08 DIAGNOSIS — Z91040 Latex allergy status: Secondary | ICD-10-CM | POA: Insufficient documentation

## 2022-04-08 DIAGNOSIS — Z7984 Long term (current) use of oral hypoglycemic drugs: Secondary | ICD-10-CM | POA: Diagnosis not present

## 2022-04-08 DIAGNOSIS — Z79899 Other long term (current) drug therapy: Secondary | ICD-10-CM | POA: Insufficient documentation

## 2022-04-08 DIAGNOSIS — K9423 Gastrostomy malfunction: Secondary | ICD-10-CM

## 2022-04-08 DIAGNOSIS — K9429 Other complications of gastrostomy: Secondary | ICD-10-CM | POA: Diagnosis present

## 2022-04-08 MED ORDER — IOHEXOL 300 MG/ML  SOLN
30.0000 mL | Freq: Once | INTRAMUSCULAR | Status: AC | PRN
  Administered 2022-04-08: 30 mL

## 2022-04-08 NOTE — ED Provider Notes (Signed)
[facility] DEPT Provider Note   CSN: [id number] Arrival date & time: 04/08/22  1927     History  Chief Complaint  Patient presents with   GI Problem    Feeding tube problem    Sarah Boone is a 88 y.o. female.  HPI    88 year old female sent to the emergency room via EMS from [place] home with chief complaint of GI tube problem.  Per EMS," there is a problem with her tube, but the facility is unsure what is going on with it.  Patient was getting her feeding and found that it was leaking this evening."   Patient has no complaints on her side.  She is demented however.  She denies any pain.  Home Medications Prior to Admission medications   Medication Sig Start Date End Date Taking? Authorizing Provider  acetaminophen (TYLENOL) 325 MG tablet Take 325 mg by mouth. 11/03/20   [provider]  ALPRAZolam Duanne Moron) 0.25 MG tablet Take 0.25 mg by mouth every 12 (twelve) hours.    [provider]  ascorbic acid (VITAMIN C) 500 MG tablet Give by tube.    [provider]  CALCIUM + VITAMIN D3 600-10 MG-MCG TABS Take by mouth daily as needed. 11/03/20   [provider]  cholestyramine Lucrezia Starch) 4 GM/DOSE powder Take by mouth. 12/19/20   [provider]  clotrimazole-betamethasone (LOTRISONE) cream Apply topically 2 (two) times daily. 09/06/21 09/06/22  [provider]  diclofenac Sodium (VOLTAREN) 1 % GEL Apply topically. 11/08/20   [provider]  diltiazem (CARDIZEM) 120 MG tablet Take 120 mg by mouth 2 (two) times daily. 12/25/20   [provider]  estradiol (ESTRACE) 0.1 MG/GM vaginal cream Place vaginally. 12/21/20   [provider]  gabapentin (NEURONTIN) 250 MG/5ML solution Take by mouth. 11/03/20   [provider]  hydrocortisone cream 0.5 %  01/24/21   [provider]  [facility]) OINT Apply topically.    [provider]  ipratropium-albuterol (DUONEB) 0.5-2.5 (3) MG/3ML SOLN Take 3 mLs by nebulization every 6 (six) hours as needed (cough, chest tightness). 04/26/21   Hunsucker, Bonna Gains, MD  lactulose (CHRONULAC) 10 GM/15ML solution SMARTSIG:Milliliter(s) By Mouth    [provider]  levothyroxine (SYNTHROID) 175 MCG tablet Take 175 mcg by mouth daily. 12/28/20   [provider]  Melatonin 5 MG CAPS Take 5 mg by mouth at bedtime. 12/30/20   [provider]  metFORMIN (GLUCOPHAGE) 500 MG tablet Take by mouth 2 (two) times daily with a meal.    [provider]  methenamine (MANDELAMINE) 0.5 GM tablet Take 500 mg by mouth 4 (four) times daily.    [provider]  mineral oil-hydrophilic petrolatum (AQUAPHOR) ointment Apply topically. 02/10/21   [provider]  nitroGLYCERIN (NITROSTAT) 0.4 MG SL tablet Place under the tongue. 11/03/20   [provider]  NOVOLOG FLEXPEN 100 UNIT/ML FlexPen Inject into the skin. 11/14/20   [provider]  [facility] powder Apply topically. 07/19/20   [provider]  ondansetron (ZOFRAN-ODT) 4 MG disintegrating tablet Take 4 mg by mouth every 8 (eight) hours as needed. 01/29/22   [provider]  polyethylene glycol powder (GLYCOLAX/MIRALAX) 17 GM/SCOOP powder Take 17 g by mouth daily. 11/03/20   [provider]  Probiotic Product (BACID) CAPS Take by mouth. 12/07/20   [provider]  SANTYL 250 UNIT/GM ointment Apply topically. 04/03/22   [provider]  sertraline (  ZOLOFT) 25 MG tablet Take 25 mg by mouth daily. 01/02/21   [provider]  sodium hypochlorite (DAKIN'S 1/2 STRENGTH) external solution Irrigate with 1 Application as directed once.    [provider]  traMADol (ULTRAM) 50 MG tablet Take 50 mg by mouth every 6 (six) hours as needed. 01/02/21   [provider]  zinc oxide 20 % ointment Apply topically. 12/21/20   [provider]       Allergies    Ciprofloxacin, Crestor [rosuvastatin], Hydrocodone, Latex, and Sulfadiazine    Review of Systems   Review of Systems  Physical Exam Updated Vital Signs BP 101/74   Pulse 61   Temp 98.1 F (36.7 C)   Resp 18   Ht 5\' 2"  (1.575 m)   Wt 83.7 kg   SpO2 95%   BMI 33.75 kg/m  Physical Exam Vitals and nursing note reviewed.  Constitutional:      Appearance: She is well-developed.  HENT:     Head: Atraumatic.  Cardiovascular:     Rate and Rhythm: Normal rate.  Pulmonary:     Effort: Pulmonary effort is normal.  Abdominal:     Palpations: Abdomen is soft.     Tenderness: There is no abdominal tenderness.     Comments: Proper dressing applied around the PEG tube.  PEG tube is in place.  There is no leakage appreciated.  The gauze is dry.  Musculoskeletal:     Cervical back: Normal range of motion and neck supple.  Skin:    General: Skin is warm and dry.  Neurological:     Mental Status: She is alert and oriented to person, place, and time.     ED Results / Procedures / Treatments   Labs (all labs ordered are listed, but only abnormal results are displayed) Labs Reviewed - No data to display  EKG None  Radiology DG ABDOMEN PEG TUBE LOCATION  Result Date: 04/08/2022 CLINICAL DATA:  Peg tube placement EXAM: ABDOMEN - 1 VIEW COMPARISON:  None Available. FINDINGS: Supine frontal view of the lower chest and upper abdomen was obtained. Peg tube is identified, with contrast outlining the gastric rugal folds. No evidence of extravasation. No bowel obstruction or ileus. IMPRESSION: 1. Peg tube within the gastric lumen. Electronically Signed   By: Randa Ngo M.D.   On: 04/08/2022 20:39    Procedures Procedures    Medications Ordered in ED Medications  iohexol (OMNIPAQUE) 300 MG/ML solution 30 mL (30 mLs Per Tube Contrast Given 04/08/22 2020)    ED Course/ Medical Decision Making/ A&P                           Medical Decision Making Amount and/or  Complexity of Data Reviewed Radiology: ordered.  Risk Prescription drug management.  88 year old patient comes in with chief complaint of PEG tube issues.  It appears to [place] that the PEG tube was likely obstructed.  On exam, the PEG tube is in place beautifully with dressing around it.  There is no leakage.  There is no sign of infection.  I called [facility] 2 separate occasions, there was no response by anyone, no answering machine.  I spoke with patient's daughter, who states that she was informed that there was leakage around her feeding tube.  Plan is to order contrast abdominal x-ray to ensure the PEG tube is in place. On our evaluation, the PEG tube is flushing without any issues.  9:19  PM X-ray of the abdomen independently interpreted.  Contrast is within the lumen of the GI tract.  History provided by EMS.  Results of the workup discussed with the patient's family.  Stable for discharge.  I tried to call contraband her 1 more time at the time of discharge, there was still no response.  Final Clinical Impression(s) / ED Diagnoses Final diagnoses:  Gastrostomy tube obstruction [facility])    Rx / DC Orders ED Discharge Orders     None         Derwood Kaplan, MD 04/08/22 2120

## 2022-04-08 NOTE — ED Notes (Signed)
Called report to [facility] and informed RN that pt is coming back to facility. Will call [facility] for transport

## 2022-04-08 NOTE — ED Triage Notes (Signed)
Pt to ED via EMS from [facility]. States there is a problem with her feeding tube but unsure what is going on with it. Pt was getting her feeding and found that it was leaking this evening. Pt had an appt last wed and tube was fine.  Pt a DNR  BP 160/80 HR 110 O2 94% on RA

## 2022-04-08 NOTE — Discharge Instructions (Addendum)
Peg tube was checked in the ER and is in the right place. It is flushing well, without any obstruction.  No leakage experienced here.

## 2022-04-09 ENCOUNTER — Encounter: Payer: Self-pay | Admitting: Family Medicine

## 2022-04-09 ENCOUNTER — Non-Acute Institutional Stay: Payer: Medicare Other | Admitting: Family Medicine

## 2022-04-09 ENCOUNTER — Telehealth: Payer: Self-pay | Admitting: Family Medicine

## 2022-04-09 VITALS — BP 122/76 | HR 82 | Temp 98.7°F | Resp 20 | Wt 185.0 lb

## 2022-04-09 DIAGNOSIS — R1319 Other dysphagia: Secondary | ICD-10-CM

## 2022-04-09 DIAGNOSIS — F01C4 Vascular dementia, severe, with anxiety: Secondary | ICD-10-CM

## 2022-04-09 DIAGNOSIS — Z515 Encounter for palliative care: Secondary | ICD-10-CM

## 2022-04-09 DIAGNOSIS — K9423 Gastrostomy malfunction: Secondary | ICD-10-CM

## 2022-04-09 NOTE — [place] Notes (Incomplete)
[facility] Pall[facility] Consult Note Telephone: [phone]  Fax: [phone]   Date of encounter: 04/09/22 3:49 PM PATIENT NAME: Sarah Boone [address] [place] [address]   [phone] (home)  DOB: 11-12-1933 MRN: [id number] PRIMARY CARE PROVIDER:    Verl Blalock, NP,  [address] [address] [phone]  REFERRING PROVIDER:   Verl Blalock, NP [address]  [place] [address] [phone]  RESPONSIBLE PARTY:    Contact Information     Name Relation Home Work Mobile   Dicken,Helen Daughter   [phone]   Hampton Abbot   [phone]        I met face to face with patient in [facility]. Palliative Care was asked to follow this patient by consultation request of  Verl Blalock, NP to address advance care planning and complex medical decision making. This is the initial visit.          ASSESSMENT, SYMPTOM MANAGEMENT AND PLAN / RECOMMENDATIONS:     Follow up Palliative Care Visit: Palliative care will continue to follow for complex medical decision making, advance care planning, and clarification of goals. Return 4 weeks or prn.    This visit was coded based on medical decision making (MDM).  PPS: 30%  HOSPICE ELIGIBILITY/DIAGNOSIS: TBD  Chief Complaint:  [facility] received a referral to follow up with patient for chronic disease management in setting of dementia.  Palliative Care is also following for advance directive planning and defining/refining goals of care.   HISTORY OF PRESENT ILLNESS:  Sarah Boone is a 88 y.o. year old female with Vascular dementia with anxiety, permanent atrial fibrillation not on anticoagulation, HTN, esophageal dysphagia and DM with tube feedings, vaginal prolapse, bedbound and with chronic indwelling foley, pressure injury of sacrum,  PEG tube malfunction. Per facility staff, pt is bedbound, chronically confused, incontinent of bowel/bladder and has alternated to sleeping days and being awake at nights. She has a hx of chronic respiratory failure, has O2 concentrator in her room but was maintaining her sats off O2.  She was noted to have had a foley placed to allow a stage 2 pressure ulcer of her buttocks to heal.  On approach she was calling out and asked this provider "Are you a Catholic?  I need some advice."  She advised that she needed this provider to call the police and states her father was a Passenger transport manager on the police force. Could not understand why she needed the police called.  She was calling out  in Spanish intermittently.  Denies pain.  Pt is an unreliable historian given her confusion.  Will attempt to follow up with one of her daughters after visit.  History obtained from review of EMR, discussion with primary team, and interview with family, facility staff/caregiver and/or Ms. Wilson.  I reviewed EMR for available labs, medications, imaging, studies and related documents.  No new records since last visit/Records reviewed and summarized above.   ROS Unable to reliably provide hx with advanced dementia  Physical Exam: Current and past weights: weight at facility today 185 lbs, on 09/01/21 ED visit, weight was 180 lbs Constitutional: NAD, feels warm and slightly clammy General:WD, obese ENMT: intact hearing CV: S1S2, IRIR, no LE edema Pulmonary: Decreased breath sounds throughout, no increased work of breathing, no cough, on room air at time of assessment Abdomen: normo-active BS + 4 quadrants, soft and  non tender, mild distension with feeding tube LUQ. Tube feedings continuous from 8 am -4 am at 65 cc/hr (interrupted to allow admin of Levothyroxine) GU: deferred MSK: moves upper extremities, bedbound Skin: warm and clammy on upper arms, covered in 2-3 blankets but does not allow staff to remove any Neuro:  noted  generalized weakness, noted  significant cognitive impairment Psych: moderately anxious affect-yelling out, A and O x 1 Hem/lymph/immuno: no widespread bruising  CURRENT PROBLEM LIST:  Patient Active Problem List   Diagnosis Date Noted  . Ambulatory dysfunction 10/27/2021  . Bedbound 10/27/2021  . Chronic indwelling Foley catheter 10/27/2021  . Incontinence of feces 10/27/2021  . Urinary incontinence 10/27/2021  . Pressure injury of skin of sacral region 10/27/2021  . Status post insertion of percutaneous endoscopic gastrostomy (PEG) tube ([place]) 10/27/2021  . Vaginal vault prolapse 10/27/2021  . Dementia ([place]) 09/05/2021  . Esophageal dysphagia 09/05/2021  . Hyperglycemia due to diabetes mellitus ([place]) 09/04/2021  . PEG tube malfunction ([place]) 09/04/2021  . UTI (urinary tract infection) 09/04/2021  . Permanent atrial fibrillation ([place]) 07/04/2021  . SOB (shortness of breath) 07/04/2021  . Primary hypertension 07/04/2021  . Severe vascular dementia with anxiety ([place]) 07/04/2021  . DNR (do not resuscitate) 07/04/2021  . Type 2 diabetes mellitus ([place]) 01/12/2021  . RBBB 01/21/2020   PAST MEDICAL HISTORY:  Active Ambulatory Problems    Diagnosis Date Noted  . Type 2 diabetes mellitus ([place]) 01/12/2021  . Permanent atrial fibrillation ([place]) 07/04/2021  . SOB (shortness of breath) 07/04/2021  . Primary hypertension 07/04/2021  . Severe vascular dementia with anxiety ([place]) 07/04/2021  . DNR (do not resuscitate) 07/04/2021  . Ambulatory dysfunction 10/27/2021  . Bedbound 10/27/2021  . Chronic indwelling Foley catheter 10/27/2021  . Incontinence of feces 10/27/2021  . Urinary incontinence 10/27/2021  . Dementia ([place]) 09/05/2021  . Esophageal dysphagia 09/05/2021  . Hyperglycemia due to diabetes mellitus ([place]) 09/04/2021  . PEG tube malfunction ([place]) 09/04/2021  . Pressure injury of skin of sacral region 10/27/2021  . RBBB 01/21/2020  . Status post insertion of percutaneous  endoscopic gastrostomy (PEG) tube ([place]) 10/27/2021  . UTI (urinary tract infection) 09/04/2021  . Vaginal vault prolapse 10/27/2021   Resolved Ambulatory Problems    Diagnosis Date Noted  . No Resolved Ambulatory Problems   Past Medical History:  Diagnosis Date  . Anxiety   . Arthritis   . Atrial fibrillation ([place])   . Chronic respiratory failure ([place])   . Constipation   . Esophageal stenosis status post PEG placement   . Gastritis   . Hiatal hernia   . Hyperlipidemia   . Hypertension   . Hyponatremia   . Hypothyroidism   . Insomnia   . Oxygen dependent    SOCIAL HX:  Social History   Tobacco Use  . Smoking status: Never  . Smokeless tobacco: Never  Substance Use Topics  . Alcohol use: Not Currently   FAMILY HX:  Family History  Problem Relation Age of Onset  . Angina Mother   . Heart disease Father   . Diabetes Father   . Diabetes Sister   . Diabetes Brother   . Fibromyalgia Daughter   . Diabetes Daughter   . Arthritis Daughter   . Irritable bowel syndrome Daughter   . Hypertension Daughter   . Diabetes Daughter        Preferred Pharmacy: ALLERGIES:  Allergies  Allergen Reactions  . Ciprofloxacin     Generic   . Crestor [Rosuvastatin]   .  Hydrocodone   . Latex   . Sulfadiazine      PERTINENT MEDICATIONS:  Outpatient Encounter Medications as of 04/09/2022  Medication Sig  . acetaminophen (TYLENOL) 325 MG tablet Take 325 mg by mouth.  . ALPRAZolam (XANAX) 0.25 MG tablet Take 0.25 mg by mouth every 12 (twelve) hours.  Marland Kitchen ascorbic acid (VITAMIN C) 500 MG tablet Give by tube.  Marland Kitchen CALCIUM + VITAMIN D3 600-10 MG-MCG TABS Take by mouth daily as needed.  . cholestyramine (QUESTRAN) 4 GM/DOSE powder Take by mouth.  . clotrimazole-betamethasone (LOTRISONE) cream Apply topically 2 (two) times daily.  . diclofenac Sodium (VOLTAREN) 1 % GEL Apply topically.  Marland Kitchen diltiazem (CARDIZEM) 120 MG tablet Take 120 mg by mouth 2 (two) times daily.  Marland Kitchen estradiol (ESTRACE)  0.1 MG/GM vaginal cream Place vaginally.  . gabapentin (NEURONTIN) 250 MG/5ML solution Take by mouth.  . hydrocortisone cream 0.5 %   . Infant Care Products [facility]) OINT Apply topically.  Marland Kitchen ipratropium-albuterol (DUONEB) 0.5-2.5 (3) MG/3ML SOLN Take 3 mLs by nebulization every 6 (six) hours as needed (cough, chest tightness).  Marland Kitchen lactulose (CHRONULAC) 10 GM/15ML solution SMARTSIG:Milliliter(s) By Mouth  . levothyroxine (SYNTHROID) 175 MCG tablet Take 175 mcg by mouth daily.  . Melatonin 5 MG CAPS Take 5 mg by mouth at bedtime.  . metFORMIN (GLUCOPHAGE) 500 MG tablet Take by mouth 2 (two) times daily with a meal.  . methenamine (MANDELAMINE) 0.5 GM tablet Take 500 mg by mouth 4 (four) times daily.  . mineral oil-hydrophilic petrolatum (AQUAPHOR) ointment Apply topically.  . nitroGLYCERIN (NITROSTAT) 0.4 MG SL tablet Place under the tongue.  Marland Kitchen NOVOLOG FLEXPEN 100 UNIT/ML FlexPen Inject into the skin.  Marland Kitchen NYAMYC powder Apply topically.  . ondansetron (ZOFRAN-ODT) 4 MG disintegrating tablet Take 4 mg by mouth every 8 (eight) hours as needed.  . polyethylene glycol powder (GLYCOLAX/MIRALAX) 17 GM/SCOOP powder Take 17 g by mouth daily.  . Probiotic Product (BACID) CAPS Take by mouth.  Annitta Needs 250 UNIT/GM ointment Apply topically.  . sertraline (ZOLOFT) 25 MG tablet Take 25 mg by mouth daily.  . sodium hypochlorite (DAKIN'S 1/2 STRENGTH) external solution Irrigate with 1 Application as directed once.  . traMADol (ULTRAM) 50 MG tablet Take 50 mg by mouth every 6 (six) hours as needed.  . zinc oxide 20 % ointment Apply topically.   No facility-administered encounter medications on file as of 04/09/2022.     -------------------------------------------------------------------------------------------------------------------------------------------------------------------------------------------------------------------------------------------- Advance Care Planning/Goals of Care: Goals include to  maximize quality of life and symptom management. Patient/health care surrogate gave his/her permission to discuss.Our advance care planning conversation included a discussion about:    The value and importance of advance care planning  Experiences with loved ones who have been seriously ill or have died  Exploration of personal, cultural or spiritual beliefs that might influence medical decisions  Exploration of goals of care in the event of a sudden injury or illness  Identification  of a healthcare agent  Review and updating or creation of an  advance directive document . Decision not to resuscitate or to de-escalate disease focused treatments due to poor prognosis. CODE STATUS:     Thank you for the opportunity to participate in the care of Ms. Kutch.  The palliative care team will continue to follow. Please call our office at [phone] if we can be of additional assistance.   Marijo Conception, FNP-C  COVID-19 PATIENT SCREENING TOOL Asked and negative response unless otherwise noted:  Have you had symptoms of  covid, tested positive or been in contact with someone with symptoms/positive test in the past 5-10 days?

## 2022-04-09 NOTE — [place] Notes (Unsigned)
[facility] Palliative [facility] Consult Note Telephone: [phone]  Fax: [phone]   Date of encounter: 04/09/22 3:49 PM PATIENT NAME: Sarah Boone [address] [place] [address]   [phone] (home)  DOB: 1933-05-22 MRN: [id number] PRIMARY CARE PROVIDER:    Verl Blalock, NP,  [address] [place] [address] [phone]  REFERRING PROVIDER:   Verl Blalock, NP [address]  [place] [address] [phone]  RESPONSIBLE PARTY:    Contact Information     Name Relation Home Work Mobile   Dicken,Helen Daughter   [phone]   Hampton Abbot   [phone]        I met face to face with patient in [facility]. Palliative Care was asked to follow this patient by consultation request of  Verl Blalock, NP to address advance care planning and complex medical decision making. This is the initial visit.          ASSESSMENT, SYMPTOM MANAGEMENT AND PLAN / RECOMMENDATIONS:   Esophageal dysphagia/PEG tube malfunction Pt has had weight gain, question if she has esophageal dysmotility with stenosis that was not amenable to surgical dilatation if she may also have intestinal dysmotility which is causing leaking at times from stoma if she is full. Has post pyloric placement so continuous feeds have been shown to decrease aspiration risk more so than bolus feeds. While pt is off continuous in the am for Synthroid would check residual 2 hours after and document.   Given DM she may also have some gastroparesis, recommend trial of Reglan 5 mg per tube TID. Recommend flushing tube with 20 cc free water after meds to keep tube from clogging. If residual high or this does not help may want to consider reducing rate of tube feed to 50 cc/hr. Question if esophageal stenosis could be contributing to increased aspiration of gastric  acids. Recommend mouth care TID. Discussed potential for reflux of feeding and aspiration from below if intestinal peristalsis slow with pt's daughter.   At present time she does not want to stop tube feedings.   Palliative Care Encounter Discussed MOST with daughter.  She will talk over with her sister and call back later in the week if she wants to initiate it.   Advised that progression of dementia is for pt not to eat or drink at times. Advised that each physical insult (I.e-aspiration pneumonia or UTI) will cause a decline in patient's function that she will likely not completely recover to baseline. Asked about wishes for goals of care-she does not want CPR or intubation and does not want her mom to suffer. Pt has indwelling foley to allow pressure ulcer to heal on her buttocks but this will also put her at risk of infection. Advised that she is not a candidate for hospice care with tube feedings as this is considered aggressive treatment.   3.    Severe  vascular dementia with anxiety PPS 30% FAST 7 score 7C Bedbound   Follow up Palliative Care Visit: Palliative care will continue to follow for complex medical decision making, advance care planning, and clarification of goals. Return 4 weeks or prn.    This visit was coded based on medical decision making (MDM).  PPS: 30%  HOSPICE ELIGIBILITY/DIAGNOSIS: TBD  Chief Complaint:  Sarah Boone received a referral to follow up with patient for chronic disease management in setting of dementia.  Palliative  Care is also following for advance directive planning and defining/refining goals of care.   HISTORY OF PRESENT ILLNESS:  Sarah Boone is a 88 y.o. year old female with Vascular dementia with anxiety, permanent atrial fibrillation not on anticoagulation, HTN, esophageal dysphagia and DM with tube feedings, vaginal prolapse, bedbound and with chronic indwelling foley, pressure injury of sacrum, PEG tube  malfunction. Per facility staff, pt is bedbound, chronically confused, incontinent of bowel/bladder and has alternated to sleeping days and being awake at nights. She has a hx of chronic respiratory failure, has O2 concentrator in her room but was maintaining her sats off O2.  She was noted to have had a foley placed to allow a stage 2 pressure ulcer of her buttocks to heal.  On approach she was calling out and asked this provider "Are you a Catholic?  I need some advice."  She advised that she needed this provider to call the police and states her father was a Passenger transport manager on the police force. Could not understand why she needed the police called.  She was calling out  in Spanish intermittently.  Denies pain.  Pt is an unreliable historian given her confusion.  Will attempt to follow up with one of her daughters after visit.  History obtained from review of EMR, discussion with daughter Sarah Boone (see telephone note), facility staff, none contributed by Ms. Leske.     I reviewed EMR for available labs, medications, imaging, studies and related documents.  Records reviewed and summarized above.   ROS Unable to reliably provide hx with advanced dementia  Physical Exam: Current and past weights: weight at facility today 185 lbs, on 09/01/21 ED visit, weight was 180 lbs Constitutional: NAD, feels warm and slightly clammy General:WD, obese ENMT: intact hearing CV: S1S2, IRIR, no LE edema Pulmonary: Decreased breath sounds throughout, no increased work of breathing, no cough, on room air at time of assessment Abdomen: normo-active BS + 4 quadrants, soft and non tender, mild distension with feeding tube LUQ. Tube feedings continuous from 8 am -4 am at 65 cc/hr (interrupted to allow admin of Levothyroxine) GU: deferred MSK: moves upper extremities, bedbound Skin: warm and clammy on upper arms, covered in 2-3 blankets but does not allow staff to remove any blankets. Wound nurse documented coccyx wound  size as 2.5 x 1.5 x 0.3 cm. Neuro:  noted generalized weakness, noted  significant cognitive impairment Psych: moderately anxious affect-yelling out, A and O x 1 Hem/lymph/immuno: no widespread bruising  CURRENT PROBLEM LIST:  Patient Active Problem List   Diagnosis Date Noted   Ambulatory dysfunction 10/27/2021   Bedbound 10/27/2021   Chronic indwelling Foley catheter 10/27/2021   Incontinence of feces 10/27/2021   Urinary incontinence 10/27/2021   Pressure injury of skin of sacral region 10/27/2021   Status post insertion of percutaneous endoscopic gastrostomy (PEG) tube ([place]) 10/27/2021   Vaginal vault prolapse 10/27/2021   Dementia ([place]) 09/05/2021   Esophageal dysphagia 09/05/2021   Hyperglycemia due to diabetes mellitus ([place]) 09/04/2021   PEG tube malfunction ([place]) 09/04/2021   UTI (urinary tract infection) 09/04/2021   Permanent atrial fibrillation ([place]) 07/04/2021   SOB (shortness of breath) 07/04/2021   Primary hypertension 07/04/2021   Severe vascular dementia with anxiety ([place]) 07/04/2021   DNR (do not resuscitate) 07/04/2021   Type 2 diabetes mellitus ([place]) 01/12/2021   RBBB 01/21/2020   PAST MEDICAL HISTORY:  Active Ambulatory Problems    Diagnosis Date Noted   Type 2 diabetes mellitus ([place]) 01/12/2021  Permanent atrial fibrillation ([place]) 07/04/2021   SOB (shortness of breath) 07/04/2021   Primary hypertension 07/04/2021   Severe vascular dementia with anxiety ([place]) 07/04/2021   DNR (do not resuscitate) 07/04/2021   Ambulatory dysfunction 10/27/2021   Bedbound 10/27/2021   Chronic indwelling Foley catheter 10/27/2021   Incontinence of feces 10/27/2021   Urinary incontinence 10/27/2021   Dementia ([place]) 09/05/2021   Esophageal dysphagia 09/05/2021   Hyperglycemia due to diabetes mellitus ([place]) 09/04/2021   PEG tube malfunction ([place]) 09/04/2021   Pressure injury of skin of sacral region 10/27/2021   RBBB 01/21/2020   Status post insertion of percutaneous  endoscopic gastrostomy (PEG) tube ([place]) 10/27/2021   UTI (urinary tract infection) 09/04/2021   Vaginal vault prolapse 10/27/2021   Resolved Ambulatory Problems    Diagnosis Date Noted   No Resolved Ambulatory Problems   Past Medical History:  Diagnosis Date   Anxiety    Arthritis    Atrial fibrillation (HCC)    Chronic respiratory failure (HCC)    Constipation    Esophageal stenosis status post PEG placement    Gastritis    Hiatal hernia    Hyperlipidemia    Hypertension    Hyponatremia    Hypothyroidism    Insomnia    Oxygen dependent    SOCIAL HX:  Social History   Tobacco Use   Smoking status: Never   Smokeless tobacco: Never  Substance Use Topics   Alcohol use: Not Currently   FAMILY HX:  Family History  Problem Relation Age of Onset   Angina Mother    Heart disease Father    Diabetes Father    Diabetes Sister    Diabetes Brother    Fibromyalgia Daughter    Diabetes Daughter    Arthritis Daughter    Irritable bowel syndrome Daughter    Hypertension Daughter    Diabetes Daughter        Preferred Pharmacy: ALLERGIES:  Allergies  Allergen Reactions   Ciprofloxacin     Generic    Crestor [Rosuvastatin]    Hydrocodone    Latex    Sulfadiazine      PERTINENT MEDICATIONS:  Outpatient Encounter Medications as of 04/09/2022  Medication Sig   acetaminophen (TYLENOL) 325 MG tablet Take 325 mg by mouth.   ALPRAZolam (XANAX) 0.25 MG tablet Take 0.25 mg by mouth every 12 (twelve) hours.   ascorbic acid (VITAMIN C) 500 MG tablet Give by tube.   CALCIUM + VITAMIN D3 600-10 MG-MCG TABS Take by mouth daily as needed.   cholestyramine (QUESTRAN) 4 GM/DOSE powder Take by mouth.   clotrimazole-betamethasone (LOTRISONE) cream Apply topically 2 (two) times daily.   diclofenac Sodium (VOLTAREN) 1 % GEL Apply topically.   diltiazem (CARDIZEM) 120 MG tablet Take 120 mg by mouth 2 (two) times daily.   estradiol (ESTRACE) 0.1 MG/GM vaginal cream Place vaginally.    gabapentin (NEURONTIN) 250 MG/5ML solution Take by mouth.   hydrocortisone cream 0.5 %    Infant Care Products (DERMAC[facility]OUD) OINT Apply topically.   ipratropium-albuterol (DUONEB) 0.5-2.5 (3) MG/3ML SOLN Take 3 mLs by nebulization every 6 (six) hours as needed (cough, chest tightness).   lactulose (CHRONULAC) 10 GM/15ML solution SMARTSIG:Milliliter(s) By Mouth   levothyroxine (SYNTHROID) 175 MCG tablet Take 175 mcg by mouth daily.   Melatonin 5 MG CAPS Take 5 mg by mouth at bedtime.   metFORMIN (GLUCOPHAGE) 500 MG tablet Take by mouth 2 (two) times daily with a meal.   methenamine (MANDELAMINE) 0.5 GM tablet Take  500 mg by mouth 4 (four) times daily.   mineral oil-hydrophilic petrolatum (AQUAPHOR) ointment Apply topically.   nitroGLYCERIN (NITROSTAT) 0.4 MG SL tablet Place under the tongue.   NOVOLOG FLEXPEN 100 UNIT/ML FlexPen Inject into the skin.   NYAMYC powder Apply topically.   ondansetron (ZOFRAN-ODT) 4 MG disintegrating tablet Take 4 mg by mouth every 8 (eight) hours as needed.   polyethylene glycol powder (GLYCOLAX/MIRALAX) 17 GM/SCOOP powder Take 17 g by mouth daily.   Probiotic Product (BACID) CAPS Take by mouth.   SANTYL 250 UNIT/GM ointment Apply topically.   sertraline (ZOLOFT) 25 MG tablet Take 25 mg by mouth daily.   sodium hypochlorite (DAKIN'S 1/2 STRENGTH) external solution Irrigate with 1 Application as directed once.   traMADol (ULTRAM) 50 MG tablet Take 50 mg by mouth every 6 (six) hours as needed.   zinc oxide 20 % ointment Apply topically.   No facility-administered encounter medications on file as of 04/09/2022.     -------------------------------------------------------------------------------------- Advance Care Planning/Goals of Care: CODE STATUS: DNR, will address goals of care when talking with daughter    Thank you for the opportunity to participate in the care of Ms. Pavlik.  The palliative care team will continue to follow. Please call our office at  [phone] if we can be of additional assistance.   Marijo Conception, FNP-C  COVID-19 PATIENT SCREENING TOOL Asked and negative response unless otherwise noted:  Have you had symptoms of covid, tested positive or been in contact with someone with symptoms/positive test in the past 5-10 days?  unknown

## 2022-04-09 NOTE — Telephone Encounter (Signed)
TCT daughter Sarah Boone. Introduced self and role with Physicist, medical.  Advised of role of symptom management advisor for facility NP and extra check in on pt.  She states pt has severe osteoporosis and had significant trouble with dysphagia. She knows that pt has dementia but states it needs to be God's timing and she doesn't feel like the decision is hers to withdraw tube feedings which she was advised to do at last ED visit.  She states pt has DNR and had made her wishes known with that respect and that she didn't want to be intubated.  Advised that nursing had indicated to me that only once has pt pulled the tube out but informed her this is not uncommon with dementia.  She mentioned that when trying to give pt anything by mouth she "chokes on it".  She said she and her sister brought her a cake for her birthday and pt "rolled it around in her mouth" then started coughing so they didn't give her anymore.  She states pt has had multiple episodes of aspiration pneumonia.  Advised her that pt's g-tube has been leaking and that this could be coming from slow peristalsis throughout GI tract with continuous feedings.  She states facility NP was talking about decreasing the rate.  Advised that even with tube feedings this did not negate the possibility of aspiration pneumonia if the peristalsis of the intestinal tract was slow and that if not emptying well feeding would take the path of least resistance and reflux up from below.  Advised that I would make recommendations and discuss with facility NP Gwenevere Ghazi to decide on a course. She states not wanting the patient to suffer.  Introduced that the DNR only came in to play if pt' heart stopped but if per se, she was having trouble breathing it did not address wishes with regard to intubation at that point.  Advised of MOST form and decisions addressed on the form with regard to CPR, breathing interventions and interventions for feeding tube, IV fluids and antibiotics.   Advised that this could be addressed with MOST and encouraged her to look up MOST-[place] on internet to look at the form and discuss with her sister.  Advised if they wanted to complete it after that I would help with completion.  She indicated on the phone that she did not want CPR, had not wanted intubation, does not want to stop tube feedings but also didn't want her mother to suffer.  Advised that as long as tube feedings were in place that Hospice would not be an option but since her mother has gained weight she would likely not meet Hospice criteria anyway.  She asked if services were covered stating her mother has insurance and Medicaid that services were covered and if she got a bill to contact our office. She verbalized understanding and appreciation for the phone call, stating she would discuss the information and MOST form with her.  Joycelyn Man FNP-C

## 2022-04-10 DIAGNOSIS — Z515 Encounter for palliative care: Secondary | ICD-10-CM | POA: Insufficient documentation

## 2022-05-01 ENCOUNTER — Ambulatory Visit: Payer: Medicare Other | Admitting: Gastroenterology

## 2022-05-23 ENCOUNTER — Emergency Department (HOSPITAL_COMMUNITY): Payer: Medicare Other

## 2022-05-23 ENCOUNTER — Encounter (HOSPITAL_COMMUNITY): Payer: Self-pay

## 2022-05-23 ENCOUNTER — Inpatient Hospital Stay (HOSPITAL_COMMUNITY)
Admission: EM | Admit: 2022-05-23 | Discharge: 2022-05-31 | DRG: 951 | Disposition: E | Payer: Medicare Other | Source: Skilled Nursing Facility | Attending: Pulmonary Disease | Admitting: Pulmonary Disease

## 2022-05-23 DIAGNOSIS — N136 Pyonephrosis: Secondary | ICD-10-CM | POA: Diagnosis present

## 2022-05-23 DIAGNOSIS — J9611 Chronic respiratory failure with hypoxia: Secondary | ICD-10-CM | POA: Diagnosis present

## 2022-05-23 DIAGNOSIS — N202 Calculus of kidney with calculus of ureter: Secondary | ICD-10-CM | POA: Diagnosis present

## 2022-05-23 DIAGNOSIS — I4811 Longstanding persistent atrial fibrillation: Secondary | ICD-10-CM | POA: Diagnosis present

## 2022-05-23 DIAGNOSIS — E876 Hypokalemia: Secondary | ICD-10-CM | POA: Diagnosis present

## 2022-05-23 DIAGNOSIS — E785 Hyperlipidemia, unspecified: Secondary | ICD-10-CM | POA: Diagnosis present

## 2022-05-23 DIAGNOSIS — Z9981 Dependence on supplemental oxygen: Secondary | ICD-10-CM

## 2022-05-23 DIAGNOSIS — Z931 Gastrostomy status: Secondary | ICD-10-CM | POA: Diagnosis not present

## 2022-05-23 DIAGNOSIS — R4020 Unspecified coma: Secondary | ICD-10-CM | POA: Diagnosis present

## 2022-05-23 DIAGNOSIS — R578 Other shock: Secondary | ICD-10-CM | POA: Diagnosis present

## 2022-05-23 DIAGNOSIS — R6521 Severe sepsis with septic shock: Secondary | ICD-10-CM

## 2022-05-23 DIAGNOSIS — F0150 Vascular dementia without behavioral disturbance: Secondary | ICD-10-CM | POA: Diagnosis present

## 2022-05-23 DIAGNOSIS — Z888 Allergy status to other drugs, medicaments and biological substances status: Secondary | ICD-10-CM

## 2022-05-23 DIAGNOSIS — Z9049 Acquired absence of other specified parts of digestive tract: Secondary | ICD-10-CM

## 2022-05-23 DIAGNOSIS — A4181 Sepsis due to Enterococcus: Secondary | ICD-10-CM | POA: Diagnosis present

## 2022-05-23 DIAGNOSIS — Z66 Do not resuscitate: Secondary | ICD-10-CM | POA: Diagnosis present

## 2022-05-23 DIAGNOSIS — Z90710 Acquired absence of both cervix and uterus: Secondary | ICD-10-CM

## 2022-05-23 DIAGNOSIS — E872 Acidosis, unspecified: Secondary | ICD-10-CM | POA: Diagnosis present

## 2022-05-23 DIAGNOSIS — Z1152 Encounter for screening for COVID-19: Secondary | ICD-10-CM

## 2022-05-23 DIAGNOSIS — E1165 Type 2 diabetes mellitus with hyperglycemia: Secondary | ICD-10-CM | POA: Diagnosis present

## 2022-05-23 DIAGNOSIS — Z833 Family history of diabetes mellitus: Secondary | ICD-10-CM

## 2022-05-23 DIAGNOSIS — B964 Proteus (mirabilis) (morganii) as the cause of diseases classified elsewhere: Secondary | ICD-10-CM | POA: Diagnosis present

## 2022-05-23 DIAGNOSIS — K559 Vascular disorder of intestine, unspecified: Secondary | ICD-10-CM | POA: Diagnosis present

## 2022-05-23 DIAGNOSIS — I10 Essential (primary) hypertension: Secondary | ICD-10-CM | POA: Diagnosis present

## 2022-05-23 DIAGNOSIS — R531 Weakness: Secondary | ICD-10-CM | POA: Diagnosis not present

## 2022-05-23 DIAGNOSIS — N200 Calculus of kidney: Secondary | ICD-10-CM | POA: Diagnosis not present

## 2022-05-23 DIAGNOSIS — E039 Hypothyroidism, unspecified: Secondary | ICD-10-CM | POA: Diagnosis present

## 2022-05-23 DIAGNOSIS — J9811 Atelectasis: Secondary | ICD-10-CM | POA: Diagnosis present

## 2022-05-23 DIAGNOSIS — Z881 Allergy status to other antibiotic agents status: Secondary | ICD-10-CM

## 2022-05-23 DIAGNOSIS — Z8701 Personal history of pneumonia (recurrent): Secondary | ICD-10-CM

## 2022-05-23 DIAGNOSIS — Z87440 Personal history of urinary (tract) infections: Secondary | ICD-10-CM

## 2022-05-23 DIAGNOSIS — T83511A Infection and inflammatory reaction due to indwelling urethral catheter, initial encounter: Secondary | ICD-10-CM | POA: Diagnosis not present

## 2022-05-23 DIAGNOSIS — N390 Urinary tract infection, site not specified: Secondary | ICD-10-CM

## 2022-05-23 DIAGNOSIS — A419 Sepsis, unspecified organism: Secondary | ICD-10-CM | POA: Diagnosis present

## 2022-05-23 DIAGNOSIS — Z515 Encounter for palliative care: Secondary | ICD-10-CM | POA: Diagnosis not present

## 2022-05-23 DIAGNOSIS — Z7984 Long term (current) use of oral hypoglycemic drugs: Secondary | ICD-10-CM

## 2022-05-23 DIAGNOSIS — Z79899 Other long term (current) drug therapy: Secondary | ICD-10-CM

## 2022-05-23 DIAGNOSIS — Z79890 Hormone replacement therapy (postmenopausal): Secondary | ICD-10-CM

## 2022-05-23 DIAGNOSIS — Z7189 Other specified counseling: Secondary | ICD-10-CM | POA: Diagnosis not present

## 2022-05-23 DIAGNOSIS — K224 Dyskinesia of esophagus: Secondary | ICD-10-CM | POA: Diagnosis present

## 2022-05-23 DIAGNOSIS — K219 Gastro-esophageal reflux disease without esophagitis: Secondary | ICD-10-CM | POA: Diagnosis present

## 2022-05-23 DIAGNOSIS — Z794 Long term (current) use of insulin: Secondary | ICD-10-CM

## 2022-05-23 DIAGNOSIS — Z91040 Latex allergy status: Secondary | ICD-10-CM

## 2022-05-23 DIAGNOSIS — Z8249 Family history of ischemic heart disease and other diseases of the circulatory system: Secondary | ICD-10-CM

## 2022-05-23 LAB — BLOOD CULTURE ID PANEL (REFLEXED) - BCID2
A.calcoaceticus-baumannii: NOT DETECTED
Bacteroides fragilis: NOT DETECTED
CTX-M ESBL: NOT DETECTED
Candida albicans: NOT DETECTED
Candida auris: NOT DETECTED
Candida glabrata: NOT DETECTED
Candida krusei: NOT DETECTED
Candida parapsilosis: NOT DETECTED
Candida tropicalis: NOT DETECTED
Carbapenem resist OXA 48 LIKE: NOT DETECTED
Carbapenem resistance IMP: NOT DETECTED
Carbapenem resistance KPC: NOT DETECTED
Carbapenem resistance NDM: NOT DETECTED
Carbapenem resistance VIM: NOT DETECTED
Cryptococcus neoformans/gattii: NOT DETECTED
Enterobacter cloacae complex: NOT DETECTED
Enterobacterales: DETECTED — AB
Enterococcus Faecium: NOT DETECTED
Enterococcus faecalis: DETECTED — AB
Escherichia coli: NOT DETECTED
Haemophilus influenzae: NOT DETECTED
Klebsiella aerogenes: NOT DETECTED
Klebsiella oxytoca: NOT DETECTED
Klebsiella pneumoniae: NOT DETECTED
Listeria monocytogenes: NOT DETECTED
Methicillin resistance mecA/C: DETECTED — AB
Neisseria meningitidis: NOT DETECTED
Proteus species: DETECTED — AB
Pseudomonas aeruginosa: NOT DETECTED
Salmonella species: NOT DETECTED
Serratia marcescens: NOT DETECTED
Staphylococcus aureus (BCID): NOT DETECTED
Staphylococcus epidermidis: DETECTED — AB
Staphylococcus lugdunensis: NOT DETECTED
Staphylococcus species: DETECTED — AB
Stenotrophomonas maltophilia: NOT DETECTED
Streptococcus agalactiae: NOT DETECTED
Streptococcus pneumoniae: NOT DETECTED
Streptococcus pyogenes: NOT DETECTED
Streptococcus species: NOT DETECTED
Vancomycin resistance: NOT DETECTED

## 2022-05-23 LAB — BLOOD GAS, VENOUS
Acid-Base Excess: 2.4 mmol/L — ABNORMAL HIGH (ref 0.0–2.0)
Bicarbonate: 27.2 mmol/L (ref 20.0–28.0)
O2 Saturation: 81.5 %
Patient temperature: 37
pCO2, Ven: 42 mmHg — ABNORMAL LOW (ref 44–60)
pH, Ven: 7.42 (ref 7.25–7.43)
pO2, Ven: 49 mmHg — ABNORMAL HIGH (ref 32–45)

## 2022-05-23 LAB — URINALYSIS, ROUTINE W REFLEX MICROSCOPIC
Bilirubin Urine: NEGATIVE
Glucose, UA: NEGATIVE mg/dL
Ketones, ur: NEGATIVE mg/dL
Nitrite: NEGATIVE
Protein, ur: 30 mg/dL — AB
RBC / HPF: >50 RBC/hpf — ABNORMAL HIGH (ref 0–5)
Specific Gravity, Urine: 1.015 (ref 1.005–1.030)
WBC, UA: >50 WBC/hpf — ABNORMAL HIGH (ref 0–5)
pH: 7 (ref 5.0–8.0)

## 2022-05-23 LAB — COMPREHENSIVE METABOLIC PANEL
ALT: 13 U/L (ref 0–44)
AST: 27 U/L (ref 15–41)
Albumin: 1.8 g/dL — ABNORMAL LOW (ref 3.5–5.0)
Alkaline Phosphatase: 63 U/L (ref 38–126)
Anion gap: 7 (ref 5–15)
BUN: 19 mg/dL (ref 8–23)
CO2: 18 mmol/L — ABNORMAL LOW (ref 22–32)
Calcium: 5.6 mg/dL — CL (ref 8.9–10.3)
Chloride: 112 mmol/L — ABNORMAL HIGH (ref 98–111)
Creatinine, Ser: 0.37 mg/dL — ABNORMAL LOW (ref 0.44–1.00)
GFR, Estimated: >60 mL/min (ref >60)
Glucose, Bld: 113 mg/dL — ABNORMAL HIGH (ref 70–99)
Potassium: 2.9 mmol/L — ABNORMAL LOW (ref 3.5–5.1)
Sodium: 137 mmol/L (ref 135–145)
Total Bilirubin: 0.3 mg/dL (ref 0.3–1.2)
Total Protein: 3.9 g/dL — ABNORMAL LOW (ref 6.5–8.1)

## 2022-05-23 LAB — PROTIME-INR
INR: 1.2 (ref 0.8–1.2)
Prothrombin Time: 14.6 seconds (ref 11.4–15.2)

## 2022-05-23 LAB — CBC WITH DIFFERENTIAL/PLATELET
Abs Immature Granulocytes: 0.54 10*3/uL — ABNORMAL HIGH (ref 0.00–0.07)
Basophils Absolute: 0.1 10*3/uL (ref 0.0–0.1)
Basophils Relative: 0 %
Eosinophils Absolute: 0 10*3/uL (ref 0.0–0.5)
Eosinophils Relative: 0 %
HCT: 39.9 % (ref 36.0–46.0)
Hemoglobin: 12.8 g/dL (ref 12.0–15.0)
Immature Granulocytes: 2 %
Lymphocytes Relative: 1 %
Lymphs Abs: 0.4 10*3/uL — ABNORMAL LOW (ref 0.7–4.0)
MCH: 27.9 pg (ref 26.0–34.0)
MCHC: 32.1 g/dL (ref 30.0–36.0)
MCV: 87.1 fL (ref 80.0–100.0)
Monocytes Absolute: 0.8 10*3/uL (ref 0.1–1.0)
Monocytes Relative: 3 %
Neutro Abs: 29.5 10*3/uL — ABNORMAL HIGH (ref 1.7–7.7)
Neutrophils Relative %: 94 %
Platelets: 195 10*3/uL (ref 150–400)
RBC: 4.58 MIL/uL (ref 3.87–5.11)
RDW: 15 % (ref 11.5–15.5)
WBC: 31.3 10*3/uL — ABNORMAL HIGH (ref 4.0–10.5)
nRBC: 0 % (ref 0.0–0.2)

## 2022-05-23 LAB — RESP PANEL BY RT-PCR (RSV, FLU A&B, COVID)  RVPGX2
Influenza A by PCR: NEGATIVE
Influenza B by PCR: NEGATIVE
Resp Syncytial Virus by PCR: NEGATIVE
SARS Coronavirus 2 by RT PCR: NEGATIVE

## 2022-05-23 LAB — I-STAT CHEM 8, ED
BUN: 26 mg/dL — ABNORMAL HIGH (ref 8–23)
Calcium, Ion: 1.06 mmol/L — ABNORMAL LOW (ref 1.15–1.40)
Chloride: 96 mmol/L — ABNORMAL LOW (ref 98–111)
Creatinine, Ser: 0.4 mg/dL — ABNORMAL LOW (ref 0.44–1.00)
Glucose, Bld: 156 mg/dL — ABNORMAL HIGH (ref 70–99)
HCT: 40 % (ref 36.0–46.0)
Hemoglobin: 13.6 g/dL (ref 12.0–15.0)
Potassium: 4.2 mmol/L (ref 3.5–5.1)
Sodium: 132 mmol/L — ABNORMAL LOW (ref 135–145)
TCO2: 26 mmol/L (ref 22–32)

## 2022-05-23 LAB — LACTIC ACID, PLASMA
Lactic Acid, Venous: 5.3 mmol/L (ref 0.5–1.9)
Lactic Acid, Venous: 5.7 mmol/L (ref 0.5–1.9)
Lactic Acid, Venous: 4.8 mmol/L (ref 0.5–1.9)

## 2022-05-23 LAB — APTT: aPTT: 25 seconds (ref 24–36)

## 2022-05-23 MED ORDER — SODIUM CHLORIDE 0.9 % IV SOLN
25.0000 ug/min | INTRAVENOUS | Status: DC
  Filled 2022-05-23: qty 2

## 2022-05-23 MED ORDER — GLYCOPYRROLATE 0.2 MG/ML IJ SOLN: 0.2000 mg | INTRAMUSCULAR | Status: DC | PRN

## 2022-05-23 MED ORDER — SODIUM CHLORIDE 0.9 % IV SOLN
250.0000 mL | INTRAVENOUS | Status: DC
  Administered 2022-05-23: 250 mL via INTRAVENOUS

## 2022-05-23 MED ORDER — HALOPERIDOL LACTATE 5 MG/ML IJ SOLN
5.0000 mg | Freq: Four times a day (QID) | INTRAMUSCULAR | Status: DC | PRN
  Filled 2022-05-23 (×2): qty 1

## 2022-05-23 MED ORDER — CALCIUM GLUCONATE-NACL 1-0.675 GM/50ML-% IV SOLN
1.0000 g | Freq: Once | INTRAVENOUS | Status: AC
  Administered 2022-05-23: 1000 mg via INTRAVENOUS
  Filled 2022-05-23: qty 50

## 2022-05-23 MED ORDER — FENTANYL CITRATE PF 50 MCG/ML IJ SOSY
50.0000 ug | PREFILLED_SYRINGE | Freq: Once | INTRAMUSCULAR | Status: AC
  Administered 2022-05-23: 50 ug via INTRAVENOUS

## 2022-05-23 MED ORDER — GLYCOPYRROLATE 1 MG PO TABS: 1.0000 mg | ORAL_TABLET | ORAL | Status: DC | PRN

## 2022-05-23 MED ORDER — METRONIDAZOLE 500 MG/100ML IV SOLN
500.0000 mg | Freq: Once | INTRAVENOUS | Status: AC
  Administered 2022-05-23: 500 mg via INTRAVENOUS
  Filled 2022-05-23: qty 100

## 2022-05-23 MED ORDER — SODIUM CHLORIDE 0.9 % IV SOLN: INTRAVENOUS | Status: DC

## 2022-05-23 MED ORDER — POTASSIUM CHLORIDE 10 MEQ/100ML IV SOLN
10.0000 meq | INTRAVENOUS | Status: AC
  Administered 2022-05-23 (×2): 10 meq via INTRAVENOUS
  Filled 2022-05-23 (×2): qty 100

## 2022-05-23 MED ORDER — NOREPINEPHRINE 4 MG/250ML-% IV SOLN
0.0000 ug/min | INTRAVENOUS | Status: DC
  Administered 2022-05-23: 2 ug/min via INTRAVENOUS
  Filled 2022-05-23: qty 250

## 2022-05-23 MED ORDER — DIPHENHYDRAMINE HCL 50 MG/ML IJ SOLN: 25.0000 mg | INTRAMUSCULAR | Status: DC | PRN

## 2022-05-23 MED ORDER — SODIUM CHLORIDE 0.9 % IV SOLN
2.0000 g | Freq: Once | INTRAVENOUS | Status: AC
  Administered 2022-05-23: 2 g via INTRAVENOUS
  Filled 2022-05-23: qty 12.5

## 2022-05-23 MED ORDER — VANCOMYCIN HCL IN DEXTROSE 1-5 GM/200ML-% IV SOLN
1000.0000 mg | Freq: Once | INTRAVENOUS | Status: AC
  Administered 2022-05-23: 1000 mg via INTRAVENOUS
  Filled 2022-05-23: qty 200

## 2022-05-23 MED ORDER — ACETAMINOPHEN 650 MG RE SUPP: 650.0000 mg | Freq: Four times a day (QID) | RECTAL | Status: DC | PRN

## 2022-05-23 MED ORDER — FENTANYL 2500MCG IN NS 250ML (10MCG/ML) PREMIX INFUSION
0.0000 ug/h | INTRAVENOUS | Status: DC
  Administered 2022-05-23: 50 ug/h via INTRAVENOUS
  Administered 2022-05-23: 100 ug/h via INTRAVENOUS
  Administered 2022-05-24: 150 ug/h via INTRAVENOUS
  Administered 2022-05-24: 100 ug/h via INTRAVENOUS
  Administered 2022-05-24 – 2022-05-25 (×2): 150 ug/h via INTRAVENOUS
  Filled 2022-05-23 (×4): qty 250

## 2022-05-23 MED ORDER — LACTATED RINGERS IV SOLN: INTRAVENOUS | Status: DC

## 2022-05-23 MED ORDER — MIDAZOLAM HCL 2 MG/2ML IJ SOLN
2.0000 mg | INTRAMUSCULAR | Status: DC | PRN
  Administered 2022-05-24 – 2022-05-26 (×2): 2 mg via INTRAVENOUS
  Filled 2022-05-23 (×3): qty 2

## 2022-05-23 MED ORDER — SODIUM CHLORIDE 0.9 % IV SOLN: 2.0000 g | INTRAVENOUS | Status: DC

## 2022-05-23 MED ORDER — IOHEXOL 300 MG/ML  SOLN
100.0000 mL | Freq: Once | INTRAMUSCULAR | Status: AC | PRN
  Administered 2022-05-23: 100 mL via INTRAVENOUS

## 2022-05-23 MED ORDER — POLYVINYL ALCOHOL 1.4 % OP SOLN: 1.0000 [drp] | Freq: Four times a day (QID) | OPHTHALMIC | Status: DC | PRN

## 2022-05-23 MED ORDER — ACETAMINOPHEN 325 MG PO TABS: 650.0000 mg | ORAL_TABLET | Freq: Four times a day (QID) | ORAL | Status: DC | PRN

## 2022-05-23 MED ORDER — PIPERACILLIN-TAZOBACTAM 3.375 G IVPB
3.3750 g | Freq: Three times a day (TID) | INTRAVENOUS | Status: DC
  Administered 2022-05-23: 3.375 g via INTRAVENOUS
  Filled 2022-05-23: qty 50

## 2022-05-23 MED ORDER — LACTATED RINGERS IV BOLUS (SEPSIS)
1000.0000 mL | Freq: Once | INTRAVENOUS | Status: AC
  Administered 2022-05-23: 1000 mL via INTRAVENOUS

## 2022-05-23 MED ORDER — FENTANYL BOLUS VIA INFUSION
100.0000 ug | INTRAVENOUS | Status: DC | PRN
  Administered 2022-05-23 – 2022-05-26 (×5): 100 ug via INTRAVENOUS

## 2022-05-23 MED ORDER — GLYCOPYRROLATE 0.2 MG/ML IJ SOLN
0.2000 mg | INTRAMUSCULAR | Status: DC | PRN
  Administered 2022-05-23 – 2022-05-26 (×3): 0.2 mg via INTRAVENOUS
  Filled 2022-05-23 (×3): qty 1

## 2022-05-23 MED ORDER — LACTATED RINGERS IV BOLUS
1000.0000 mL | Freq: Once | INTRAVENOUS | Status: AC
  Administered 2022-05-23: 1000 mL via INTRAVENOUS

## 2022-05-23 MED ORDER — ONDANSETRON HCL 4 MG/2ML IJ SOLN: 4.0000 mg | Freq: Four times a day (QID) | INTRAMUSCULAR | Status: DC | PRN

## 2022-05-23 MED ORDER — SODIUM CHLORIDE (PF) 0.9 % IJ SOLN
INTRAMUSCULAR | Status: AC
  Filled 2022-05-23: qty 50

## 2022-05-23 MED ORDER — ONDANSETRON 4 MG PO TBDP: 4.0000 mg | ORAL_TABLET | Freq: Four times a day (QID) | ORAL | Status: DC | PRN

## 2022-05-23 NOTE — ED Provider Notes (Signed)
Discussed with Dr. Felipa Eth of urology.  He is aware of patient's case.  He is aware she is in septic shock and extremely ill.  His recommendation would be possible nephrostomy tube if family wish to go that route.  He will evaluate.  Critical care to admit the patient.   Ezequiel Essex, MD 05/14/2022 [phone]

## 2022-05-23 NOTE — H&P (Signed)
NAMERamie Boone, MRN:  [id number], DOB:  [birth year], LOS: 0 ADMISSION DATE:  03-Jun-2022, CONSULTATION DATE:  June 03, 2022 REFERRING MD:  Horton, CHIEF COMPLAINT:  fever   History of Present Illness:  89 year old woman w/ hx of dementia p/w fevers, abdominal distension from SNF.  Bps low despite fluids in ER so PCCM to admit.  Lab workup in ER reveals lactic acidosis, hypoxemia, hypocalcemia, leukocytosis.  CT of abd showing atelectasis, obstructing R renal stone, portal gas without other signs of intestinal ischemia.  EDP spoke with daughter: okay for peripheral pressors but no escalation.  I called daughter Sarah Boone and confirmed no nephrostomy tube, no surgery, keep things as is until they come up to say goodbye.  Patient I am not sure speaks english no is she talking much in general.  Pertinent  Medical History  Afib not on [facility]  Significant Hospital Events: Including procedures, antibiotic start and stop dates in addition to other pertinent events     Interim History / Subjective:  Admitting  Objective   Blood pressure 103/67, pulse (!) 133, temperature 98.3 F (36.8 C), temperature source Oral, resp. rate (!) 23, SpO2 99 %.       No intake or output data in the 24 hours ending 2022/06/03 0731 There were no vitals filed for this visit.  Examination: General: ill appearing pale woman laying in bed HENT: MM slightly dry, trachea midline Lungs: Diminished bases, tachypneic Cardiovascular: tachy, irregular ext warm Abdomen: soft, mildly tender to deep palpation, PEG in place bilious output Extremities: +anasarca, warm to touch Neuro: moves ext to command Skin: pale, no rashes  Labs reviewed: see HPI  Resolved Hospital Problem list   N/A  Assessment & Plan:  Septic/distributive shock: lactate holding steady which is ominous.  Portal gas could portend intestinal ischemia which she is at risk for given her non-anticoagulated Afib.  This could also just be infected stone.   Regardless given her baseline advanced vascular dementia and daughters' desire for her not to suffer, most reasonable approach here may be more palliative.  Daughter is en route but agreed on phone with this. Afib/RVR, Hx HTN, esopahgeal dysmotility post PEG, HLD, hypothyroidism, DM2  - peripheral pressor protocol - zosyn - GOC when daughters arrive - in hospital death expected - DNR  Best Practice (right click and "Reselect all SmartList Selections" daily)   Diet/type: NPO DVT prophylaxis: not indicated GI prophylaxis: N/A Last date of multidisciplinary goals of care discussion [pending]  Labs   CBC: Recent Labs  Lab 2022/06/03 0216 06-03-2022 0308  WBC 31.3*  --   NEUTROABS 29.5*  --   HGB 12.8 13.6  HCT 39.9 40.0  MCV 87.1  --   PLT 195  --     Basic Metabolic Panel: Recent Labs  Lab Jun 03, 2022 0216 2022-06-03 0308  NA 137 132*  K 2.9* 4.2  CL 112* 96*  CO2 18*  --   GLUCOSE 113* 156*  BUN 19 26*  CREATININE 0.37* 0.40*  CALCIUM 5.6*  --    GFR: CrCl cannot be calculated (Unknown ideal weight.). Recent Labs  Lab 03-Jun-2022 0216 June 03, 2022 0420 06-03-22 0623  WBC 31.3*  --   --   LATICACIDVEN 5.3* 5.7* 4.8*    Liver Function Tests: Recent Labs  Lab Jun 03, 2022 0216  AST 27  ALT 13  ALKPHOS 63  BILITOT 0.3  PROT 3.9*  ALBUMIN 1.8*   No results for input(s): "LIPASE", "AMYLASE" in the last 168 hours. No  results for input(s): "AMMONIA" in the last 168 hours.  ABG    Component Value Date/Time   HCO3 27.2 06-01-22 0216   TCO2 26 01-Jun-2022 0308   O2SAT 81.5 2022-06-01 0216     Coagulation Profile: Recent Labs  Lab June 01, 2022 0216  INR 1.2    Cardiac Enzymes: No results for input(s): "CKTOTAL", "CKMB", "CKMBINDEX", "TROPONINI" in the last 168 hours.  HbA1C: No results found for: "HGBA1C"  CBG: No results for input(s): "GLUCAP" in the last 168 hours.  Review of Systems:   Seems to be speaking [place] intermittently, cannot really answer  questions  Past Medical History:  She,  has a past medical history of Anxiety, Arthritis, Atrial fibrillation ([place]), Chronic respiratory failure ([place]), Constipation, Dementia ([place]), Esophageal stenosis status post PEG placement, Gastritis, Hiatal hernia, Hyperlipidemia, Hypertension, Hyponatremia, Hypothyroidism, Insomnia, Oxygen dependent, and Type 2 diabetes mellitus ([place]).   Surgical History:   Past Surgical History:  Procedure Laterality Date   ABDOMINAL HYSTERECTOMY     APPENDECTOMY     CHOLECYSTECTOMY     IR PATIENT EVAL TECH 0-60 MINS  03/10/2021   THYROID SURGERY     TONSILLECTOMY     vaginal polyps excision       Social History:   reports that she has never smoked. She has never used smokeless tobacco. She reports that she does not currently use alcohol. She reports that she does not use drugs.   Family History:  Her family history includes Angina in her mother; Arthritis in her daughter; Diabetes in her brother, daughter, daughter, father, and sister; Fibromyalgia in her daughter; Heart disease in her father; Hypertension in her daughter; Irritable bowel syndrome in her daughter.   Allergies Allergies  Allergen Reactions   Ciprofloxacin     Generic    Crestor [Rosuvastatin]    Hydrocodone    Latex    Sulfadiazine      Home Medications  Prior to Admission medications   Medication Sig Start Date End Date Taking? Authorizing Provider  acetaminophen (TYLENOL) 325 MG tablet Place 650 mg into feeding tube every 6 (six) hours as needed for fever or moderate pain. 11/03/20  Yes [provider]  ascorbic acid (VITAMIN C) 500 MG tablet Place 500 mg into feeding tube daily.   Yes [provider]  CALCIUM + VITAMIN D3 600-10 MG-MCG TABS Give 1 tablet by tube daily. 11/03/20  Yes [provider]  cholestyramine Lucrezia Starch) 4 GM/DOSE powder Place 4 g into feeding tube daily as needed (loose stool). 12/19/20  Yes [provider]   clotrimazole-betamethasone (LOTRISONE) cream Apply topically 2 (two) times daily. 09/06/21 09/06/22 Yes [provider]  diclofenac Sodium (VOLTAREN) 1 % GEL Apply 2 g topically 2 (two) times daily as needed (pain right hip/knee). 11/08/20  Yes [provider]  diltiazem (CARDIZEM) 120 MG tablet Take 120 mg by mouth 3 (three) times daily. 12/25/20  Yes [provider]  estradiol (ESTRACE) 0.1 MG/GM vaginal cream Place 1 Applicatorful vaginally at bedtime. 12/21/20  Yes [provider]  famotidine (PEPCID) 20 MG tablet Place 20 mg into feeding tube 2 (two) times daily.   Yes [provider]  furosemide (LASIX) 20 MG tablet 10 mg by Per J Tube route daily.   Yes [provider]  guaiFENesin (ROBITUSSIN) 100 MG/5ML liquid Take 20 mLs by mouth every 4 (four) hours as needed for cough or to loosen phlegm.   Yes [provider]  ipratropium-albuterol (DUONEB) 0.5-2.5 (3) MG/3ML SOLN Take  3 mLs by nebulization every 6 (six) hours as needed (cough, chest tightness). 04/26/21  Yes Hunsucker, Lesia Sago, MD  lactulose (CHRONULAC) 10 GM/15ML solution Place 15-30 g into feeding tube as directed. Take 15 ml daily and Take 45 mls daily PRN for constipation   Yes [provider]  levothyroxine (SYNTHROID) 200 MCG tablet Place 200 mcg into feeding tube daily. 12/28/20  Yes [provider]  Melatonin 5 MG CAPS Take 5 mg by mouth at bedtime. 12/30/20  Yes [provider]  metFORMIN (GLUCOPHAGE) 500 MG tablet Place 500 mg into feeding tube 2 (two) times daily with a meal.   Yes [provider]  methenamine (HIPREX) 1 g tablet Place 1 g into feeding tube 2 (two) times daily with a meal. UTI prophylaxis   Yes [provider]  mineral oil-hydrophilic petrolatum (AQUAPHOR) ointment Apply 1 Application topically in the morning and at bedtime. 02/10/21  Yes [provider]  nitroGLYCERIN (NITROSTAT) 0.4 MG SL tablet  Place 0.4 mg under the tongue every 5 (five) minutes as needed for chest pain. 11/03/20  Yes [provider]  NOVOLOG FLEXPEN 100 UNIT/ML FlexPen Inject 2-8 Units into the skin 3 (three) times daily with meals. 11/14/20  Yes [provider]  [facility] powder Apply 1 Application topically 3 (three) times daily. Areas under breasts and skin folds 07/19/20  Yes [provider]  ondansetron (ZOFRAN-ODT) 4 MG disintegrating tablet Take 4 mg by mouth every 6 (six) hours as needed for nausea. 01/29/22  Yes [provider]  polyethylene glycol powder (GLYCOLAX/MIRALAX) 17 GM/SCOOP powder Take 17 g by mouth daily as needed for severe constipation. 11/03/20  Yes [provider]  Probiotic Product (BACID) CAPS Take 1 capsule by mouth daily. 12/07/20  Yes [provider]  SANTYL 250 UNIT/GM ointment Apply 1 Application topically daily. Apply to buttocks wound 04/03/22  Yes [provider]  sodium hypochlorite (DAKIN'S 1/2 STRENGTH) external solution Irrigate with 1 Application as directed 2 (two) times a week. On bath days and prn   Yes [provider]  traMADol (ULTRAM) 50 MG tablet Take 50 mg by mouth every 6 (six) hours as needed for moderate pain. 01/02/21  Yes [provider]  Infant Care Products (DERMACLOUD) OINT Apply 1 application  topically every 8 (eight) hours as needed (rash). Apply to buttocks wound every shift and prn soiling    [provider]     Critical care time: 31 mins

## 2022-05-23 NOTE — Progress Notes (Signed)
Pharmacy Antibiotic Note  Sarah Boone is a 89 y.o. female admitted on 05/22/2022 with  IAI .  Pharmacy has been consulted for Zosyn dosing.  Ct of abdomen shows  Portal Venous Gas in the liver, the superior mesenteric vein, and in the mesentery surrounding the Cecum And Proximal Ascending Colon. This is suspicious for proximal large bowel ischemia, however, there are little if any changes of colitis there and no convincing pneumatosis. No evidence of bowel obstruction or perforation (no pneumoperitoneum or free fluid). Percutaneous gastrostomy tube with no adverse features.  Plan: Zosyn 3.375g IV q8h (4 hour infusion). [facility] on ED visit on 04/08/22 was 83.7 kg)  Monitor clinical course, renal function, cultures as available      Temp (24hrs), Avg:98.5 F (36.9 C), Min:98.2 F (36.8 C), Max:98.9 F (37.2 C)  Recent Labs  Lab May 26, 2022 0216 26-May-2022 0308 04/30/2022 0420 05/15/2022 0623  WBC 31.3*  --   --   --   CREATININE 0.37* 0.40*  --   --   LATICACIDVEN 5.3*  --  5.7* 4.8*    CrCl cannot be calculated (Unknown ideal weight.).    Allergies  Allergen Reactions   Ciprofloxacin     Generic    Crestor [Rosuvastatin]    Hydrocodone    Latex    Sulfadiazine     Antimicrobials this admission: May 26, 2022 cefepime, vancomycin x1  05/11/2022 Zosyn >>   Dose adjustments this admission:    Microbiology results: May 26, 2022 BCx:  05/19/2022 UCx:  May 26, 2022 RVP: negative      Thank you for allowing pharmacy to be a part of this patient's care.   Royetta Asal, PharmD, BCPS May 26, 2022 10:34 AM

## 2022-05-23 NOTE — ED Notes (Signed)
ED TO INPATIENT HANDOFF REPORT  ED Nurse Name and Phone #: Dorene Sorrow Name/Age/Gender Sarah Boone 89 y.o. female Room/Bed: RESB/RESB  Code Status   Code Status: DNR  Home/SNF/Other Comfort care Patient oriented to: self Is this baseline? Yes   Triage Complete: Triage complete  Chief Complaint Septic shock (HCC) [A41.9, R65.21]  Triage Note Has pneumonia recently. Increased temp 101.8 at facility. Pt has hx of UTI, Afib no thinners. 500LR bolus administered enroute. Facility gave 2 of 325mg  of Tylenol. Facility reports abdominal distention and emesis through PEG tube. Reports bile color emesis in PEG Tube.    Allergies Allergies  Allergen Reactions   Ciprofloxacin     Generic    Crestor [Rosuvastatin]    Hydrocodone    Latex    Sulfadiazine     Level of Care/Admitting Diagnosis ED Disposition     ED Disposition  Admit   Condition  --   Comment  Hospital Area: [facility] [[id number]]  Level of Care: Palliative Care [15]  May admit patient to [facility] or [facility] if equivalent level of care is available:: Yes  Covid Evaluation: Asymptomatic - no recent exposure (last 10 days) testing not required  Diagnosis: Septic shock [facility]) [facility]  Admitting Physician: [[id number]] Lorin Glass  Attending Physician: [[id number]] Lorin Glass  Certification:: I certify this patient will need inpatient services for at least 2 midnights          B Medical/Surgery History Past Medical History:  Diagnosis Date   Anxiety    Arthritis    Atrial fibrillation (HCC)    Chronic respiratory failure (HCC)    Constipation    Dementia (HCC)    Esophageal stenosis status post PEG placement    Gastritis    Hiatal hernia    Hyperlipidemia    Hypertension    Hyponatremia    Hypothyroidism    Insomnia    Oxygen dependent    Type 2 diabetes mellitus (HCC)    Past Surgical History:  Procedure Laterality Date   ABDOMINAL HYSTERECTOMY      APPENDECTOMY     CHOLECYSTECTOMY     IR PATIENT EVAL TECH 0-60 MINS  03/10/2021   THYROID SURGERY     TONSILLECTOMY     vaginal polyps excision       A IV Location/Drains/Wounds Patient Lines/Drains/Airways Status     Active Line/Drains/Airways     Name Placement date Placement time Site Days   Peripheral IV 06/14/21 22 G Left;Posterior Hand 06/14/21  1720  Hand  343   Peripheral IV June 15, 2022 20 G Anterior;Proximal;Right Forearm Jun 15, 2022  0208  Forearm  less than 1   Peripheral IV 06-15-22 20 G Anterior;Left;Proximal Forearm 06-15-22  0249  Forearm  less than 1   Peripheral IV 06-15-22 20 G Anterior;Distal;Right Forearm 06/15/22  0540  Forearm  less than 1            Intake/Output Last 24 hours No intake or output data in the 24 hours ending 06-15-2022 1228  Labs/Imaging Results for orders placed or performed during the hospital encounter of 06/15/22 (from the past 48 hour(s))  Comprehensive metabolic panel     Status: Abnormal   Collection Time: 2022/06/15  2:16 AM  Result Value Ref Range   Sodium 137 135 - 145 mmol/L   Potassium 2.9 (L) 3.5 - 5.1 mmol/L   Chloride 112 (H) 98 - 111 mmol/L   CO2 18 (L) 22 - 32 mmol/L  Glucose, Bld 113 (H) 70 - 99 mg/dL    Comment: Glucose reference range applies only to samples taken after fasting for at least 8 hours.   BUN 19 8 - 23 mg/dL   Creatinine, Ser 0.37 (L) 0.44 - 1.00 mg/dL   Calcium 5.6 (LL) 8.9 - 10.3 mg/dL    Comment: CRITICAL RESULT CALLED TO, READ BACK BY AND VERIFIED WITH S. JAY, RN 04/30/2022 0316 BY K. DAVIS   Total Protein 3.9 (L) 6.5 - 8.1 g/dL   Albumin 1.8 (L) 3.5 - 5.0 g/dL   AST 27 15 - 41 U/L   ALT 13 0 - 44 U/L   Alkaline Phosphatase 63 38 - 126 U/L   Total Bilirubin 0.3 0.3 - 1.2 mg/dL   GFR, Estimated >60 >60 mL/min    Comment: (NOTE) Calculated using the CKD-EPI Creatinine Equation (2021)    Anion gap 7 5 - 15    Comment: Performed at [facility], [place] [address], [place], [place]  [address]  Lactic acid, plasma     Status: Abnormal   Collection Time: 05/13/2022  2:16 AM  Result Value Ref Range   Lactic Acid, Venous 5.3 (HH) 0.5 - 1.9 mmol/L    Comment: CRITICAL RESULT CALLED TO, READ BACK BY AND VERIFIED WITH Mila Palmer, RN 05/20/2022 0316 BY Raliegh Ip DAVIS Performed at [place] [address], [place], [place] [address]   CBC with Differential     Status: Abnormal   Collection Time: 05/05/2022  2:16 AM  Result Value Ref Range   WBC 31.3 (H) 4.0 - 10.5 K/uL   RBC 4.58 3.87 - 5.11 MIL/uL   Hemoglobin 12.8 12.0 - 15.0 g/dL   HCT 39.9 36.0 - 46.0 %   MCV 87.1 80.0 - 100.0 fL   MCH 27.9 26.0 - 34.0 pg   MCHC 32.1 30.0 - 36.0 g/dL   RDW 15.0 11.5 - 15.5 %   Platelets 195 150 - 400 K/uL   nRBC 0.0 0.0 - 0.2 %   Neutrophils Relative % 94 %   Neutro Abs 29.5 (H) 1.7 - 7.7 K/uL   Lymphocytes Relative 1 %   Lymphs Abs 0.4 (L) 0.7 - 4.0 K/uL   Monocytes Relative 3 %   Monocytes Absolute 0.8 0.1 - 1.0 K/uL   Eosinophils Relative 0 %   Eosinophils Absolute 0.0 0.0 - 0.5 K/uL   Basophils Relative 0 %   Basophils Absolute 0.1 0.0 - 0.1 K/uL   WBC Morphology VACUOLATED NEUTROPHILS    Immature Granulocytes 2 %   Abs Immature Granulocytes 0.54 (H) 0.00 - 0.07 K/uL    Comment: Performed at [facility], [place] [address], [place], [place] [address]  Protime-INR     Status: None   Collection Time: 05/18/2022  2:16 AM  Result Value Ref Range   Prothrombin Time 14.6 11.4 - 15.2 seconds   INR 1.2 0.8 - 1.2    Comment: (NOTE) INR goal varies based on device and disease states. Performed at [facility], [place] [address], [place], [place] [address]   Blood gas, venous (at [facility] and [facility])     Status: Abnormal   Collection Time: 05/20/2022  2:16 AM  Result Value Ref Range   pH, Ven 7.42 7.25 - 7.43   pCO2, Ven 42 (L) 44 - 60 mmHg   pO2, Ven 49 (H) 32 - 45 mmHg   Bicarbonate 27.2 20.0 - 28.0 mmol/L   Acid-Base Excess  2.4 (H) 0.0 - 2.0 mmol/L    O2 Saturation 81.5 %   Patient temperature 37.0     Comment: Performed at [facility], [address] [place], [place] [address]  APTT     Status: None   Collection Time: 05-27-2022  2:16 AM  Result Value Ref Range   aPTT 25 24 - 36 seconds    Comment: Performed at [facility], [address] [place], [place] [address]  Urinalysis, Routine w reflex microscopic     Status: Abnormal   Collection Time: 05/27/2022  2:20 AM  Result Value Ref Range   Color, Urine YELLOW YELLOW   APPearance CLOUDY (A) CLEAR   Specific Gravity, Urine 1.015 1.005 - 1.030   pH 7.0 5.0 - 8.0   Glucose, UA NEGATIVE NEGATIVE mg/dL   Hgb urine dipstick LARGE (A) NEGATIVE   Bilirubin Urine NEGATIVE NEGATIVE   Ketones, ur NEGATIVE NEGATIVE mg/dL   Protein, ur 30 (A) NEGATIVE mg/dL   Nitrite NEGATIVE NEGATIVE   Leukocytes,Ua LARGE (A) NEGATIVE   RBC / HPF >50 (H) 0 - 5 RBC/hpf   WBC, UA >50 (H) 0 - 5 WBC/hpf   Bacteria, UA MANY (A) NONE SEEN   Squamous Epithelial / HPF 0-5 0 - 5 /HPF   WBC Clumps PRESENT    Mucus PRESENT    Budding Yeast PRESENT     Comment: Performed at [facility], [address], [place], [place] [address]  Resp panel by RT-PCR (RSV, Flu A&B, Covid) Anterior Nasal Swab     Status: None   Collection Time: 2022/05/27  2:36 AM   Specimen: Anterior Nasal Swab  Result Value Ref Range   SARS Coronavirus 2 by RT PCR NEGATIVE NEGATIVE    Comment: (NOTE) SARS-CoV-2 target nucleic acids are NOT DETECTED.  The SARS-CoV-2 RNA is generally detectable in upper respiratory specimens during the acute phase of infection. The lowest concentration of SARS-CoV-2 viral copies this assay can detect is 138 copies/mL. A negative result does not preclude SARS-Cov-2 infection and should not be used as the sole basis for treatment or other patient management decisions. A negative result may occur with  improper specimen collection/handling, submission of  specimen other than nasopharyngeal swab, presence of viral mutation(s) within the areas targeted by this assay, and inadequate number of viral copies(<138 copies/mL). A negative result must be combined with clinical observations, patient history, and epidemiological information. The expected result is Negative.  Fact Sheet for Patients:  BloggerCourse.com  Fact Sheet for Healthcare Providers:  SeriousBroker.it  This test is no t yet approved or cleared by the [place] FDA and  has been authorized for detection and/or diagnosis of SARS-CoV-2 by FDA under an Emergency Use Authorization (EUA). This EUA will remain  in effect (meaning this test can be used) for the duration of the COVID-19 declaration under Section 564(b)(1) of the Act, 21 U.S.C.section 360bbb-3(b)(1), unless the authorization is terminated  or revoked sooner.       Influenza A by PCR NEGATIVE NEGATIVE   Influenza B by PCR NEGATIVE NEGATIVE    Comment: (NOTE) The Xpert Xpress SARS-CoV-2/FLU/RSV plus assay is intended as an aid in the diagnosis of influenza from Nasopharyngeal swab specimens and should not be used as a sole basis for treatment. Nasal washings and aspirates are unacceptable for Xpert Xpress SARS-CoV-2/FLU/RSV testing.  Fact Sheet for Patients: BloggerCourse.com  Fact Sheet for Healthcare Providers: SeriousBroker.it  This test is not yet approved or  cleared by the [place] and has been authorized for detection and/or diagnosis of SARS-CoV-2 by FDA under an Emergency Use Authorization (EUA). This EUA will remain in effect (meaning this test can be used) for the duration of the COVID-19 declaration under Section 564(b)(1) of the Act, 21 U.S.C. section 360bbb-3(b)(1), unless the authorization is terminated or revoked.     Resp Syncytial Virus by PCR NEGATIVE NEGATIVE    Comment:  (NOTE) Fact Sheet for Patients: BloggerCourse.com  Fact Sheet for Healthcare Providers: SeriousBroker.it  This test is not yet approved or cleared by the [place] FDA and has been authorized for detection and/or diagnosis of SARS-CoV-2 by FDA under an Emergency Use Authorization (EUA). This EUA will remain in effect (meaning this test can be used) for the duration of the COVID-19 declaration under Section 564(b)(1) of the Act, 21 U.S.C. section 360bbb-3(b)(1), unless the authorization is terminated or revoked.  Performed at [facility], [address] [place], [place] [address]   I-stat chem 8, ED (not at [facility], [facility] or [facility])     Status: Abnormal   Collection Time: 05/04/2022  3:08 AM  Result Value Ref Range   Sodium 132 (L) 135 - 145 mmol/L   Potassium 4.2 3.5 - 5.1 mmol/L   Chloride 96 (L) 98 - 111 mmol/L   BUN 26 (H) 8 - 23 mg/dL   Creatinine, Ser 5.63 (L) 0.44 - 1.00 mg/dL   Glucose, Bld 875 (H) 70 - 99 mg/dL    Comment: Glucose reference range applies only to samples taken after fasting for at least 8 hours.   Calcium, Ion 1.06 (L) 1.15 - 1.40 mmol/L   TCO2 26 22 - 32 mmol/L   Hemoglobin 13.6 12.0 - 15.0 g/dL   HCT 64.3 32.9 - 51.8 %  Lactic acid, plasma     Status: Abnormal   Collection Time: 05/22/2022  4:20 AM  Result Value Ref Range   Lactic Acid, Venous 5.7 (HH) 0.5 - 1.9 mmol/L    Comment: CRITICAL VALUE NOTED. VALUE IS CONSISTENT WITH PREVIOUSLY REPORTED/CALLED VALUE Performed at [facility], [address], [place], [place] [address]   Lactic acid, plasma     Status: Abnormal   Collection Time: 05/16/2022  6:23 AM  Result Value Ref Range   Lactic Acid, Venous 4.8 (HH) 0.5 - 1.9 mmol/L    Comment: CRITICAL VALUE NOTED. VALUE IS CONSISTENT WITH PREVIOUSLY REPORTED/CALLED VALUE Performed at [facility], [address], [place], [place] [address]    CT  ABDOMEN PELVIS W CONTRAST  Result Date: 05/12/2022 CLINICAL DATA:  89 year old female with fever of 101.8 F. Bile colored emesis. PEG tube. EXAM: CT CHEST, ABDOMEN, AND PELVIS WITH CONTRAST TECHNIQUE: Multidetector CT imaging of the chest, abdomen and pelvis was performed following the standard protocol during bolus administration of intravenous contrast. RADIATION DOSE REDUCTION: This exam was performed according to the departmental dose-optimization program which includes automated exposure control, adjustment of the mA and/or kV according to patient size and/or use of iterative reconstruction technique. CONTRAST:  OMNIPAQUE IOHEXOL 300 MG/ML  SOLN COMPARISON:  Portable chest x-ray 0237 hours today. FINDINGS: CT CHEST FINDINGS Cardiovascular: Cardiomegaly. No pericardial effusion. Calcified aortic atherosclerosis. Mediastinum/Nodes: No mediastinal mass or lymphadenopathy. There is some central pulmonary artery enlargement bilaterally and this might account for the hilum appearance on the earlier portable chest. Lungs/Pleura: Major airways are patent. Symmetric dependent lung opacity is enhancing and most compatible with atelectasis. Some superimposed bilateral  subpleural scarring. No pleural effusion or convincing acute pulmonary inflammation. Musculoskeletal: Osteopenia. Numerous chronic posterior and lateral rib fractures on the right side. Healed chronic sternal fracture suspected. No acute osseous abnormality identified. CT ABDOMEN PELVIS FINDINGS Hepatobiliary: Portal venous gas within the liver, primarily the left hepatic lobe. Dilated intra-and extrahepatic biliary tree but absent gallbladder. The CBD measures up to 19 mm diameter, and tapers distally. Questionable sludge layering in the distal CBD on series 4, image 55, no other obstructing etiology identified. Liver enhancement remains homogeneous.  No perihepatic free fluid. Pancreas: Atrophied pancreatic head and uncinate. No mass or  inflammation. Spleen: Negative. Adrenals/Urinary Tract: Mild adrenal gland thickening such as due to hyperplasia. Symmetric renal enhancement. Bilateral renal parapelvic cysts are suspected (no follow-up imaging recommended). However, there is also a large obstructing stone at the right ureteropelvic junction (series 1, image 70 and sagittal image 83, which is up to 10 mm in length by 7 mm in with. Suspected right hydronephrosis, although fairly symmetric delayed renal enhancement. Distal to the large stone the right ureter is decompressed. The left ureter appears decompressed throughout. There is mild right pararenal inflammatory stranding. Bladder is decompressed by Foley catheter. And there could be a small additional 5 mm stone within the bladder adjacent to the Foley. Incidental pelvic phleboliths. Stomach/Bowel: Oral contrast mixed with stool in redundant rectosigmoid colon. Sigmoid and descending colon diverticulosis, moderate to severe but no active inflammation. Intermittent diverticula in the transverse colon and at the right hepatic flexure without active inflammation. Abnormal mesenteric and portal venous branch vessel gas in the right lower quadrant (series 4, image 43 and series 1, image 89. This is surrounding the cecum and proximal ascending colon. The cecum is mildly dilated with gas up to 7 cm. The terminal ileum remains decompressed. Mild if any large bowel wall thickening or inflammation there. Appendix is not identified. Upstream small bowel is nondilated. Percutaneous gastrostomy tube in place with decompressed stomach. Decompressed duodenum. No pneumoperitoneum.  No free fluid identified. Vascular/Lymphatic: Portal venous gas in the superior mesenteric vein series 1, image 62. Portal venous system appears to remain patent. Aortoiliac calcified atherosclerosis. Negative for abdominal aortic aneurysm. Major arterial structures are patent. No lymphadenopathy identified. Reproductive: Surgically  absent uterus. Diminutive or absent ovaries. Other: No free fluid. Musculoskeletal: Osteopenia with age indeterminate mild L4 and L5 compression fractures. Superimposed lumbar disc, endplate, facet degeneration. Medial left pubic symphysis deformity, possibly with superimposed bone cement. No acute or suspicious osseous changes identified there. IMPRESSION: 1. Positive for Portal Venous Gas in the liver, the superior mesenteric vein, and in the mesentery surrounding the Cecum And Proximal Ascending Colon. This is suspicious for proximal large bowel ischemia, however, there are little if any changes of colitis there and no convincing pneumatosis. No evidence of bowel obstruction or perforation (no pneumoperitoneum or free fluid). Percutaneous gastrostomy tube with no adverse features. 2. Superimposed right side obstructive uropathy with a large right UPJ 10 x 7 mm stone, right hydronephrosis and pararenal inflammation. Possible small 5 mm stone in the bladder which is decompressed by Foley catheter. 3. Dilated intra- and extrahepatic biliary tree with questionable sludge in the distal CBD. But absent gallbladder and no other obstructing etiology identified. 4. Cardiomegaly. Mild lower lobe atelectasis. Aortic Atherosclerosis (ICD10-I70.0). 5. Osteopenia with age indeterminate mild L4 and L5 compression fractures. Salient findings discussed by telephone with Dr. Ross Marcus on May 28, 2022 at 05:29 . Electronically Signed   By: Odessa Fleming M.D.   On: 2022/05/28 05:35  CT Chest W Contrast  Result Date: 05/24/2022 CLINICAL DATA:  89 year old female with fever of 101.8 F. Bile colored emesis. PEG tube. EXAM: CT CHEST, ABDOMEN, AND PELVIS WITH CONTRAST TECHNIQUE: Multidetector CT imaging of the chest, abdomen and pelvis was performed following the standard protocol during bolus administration of intravenous contrast. RADIATION DOSE REDUCTION: This exam was performed according to the departmental dose-optimization  program which includes automated exposure control, adjustment of the mA and/or kV according to patient size and/or use of iterative reconstruction technique. CONTRAST:  175mL OMNIPAQUE IOHEXOL 300 MG/ML  SOLN COMPARISON:  Portable chest x-ray 0237 hours today. FINDINGS: CT CHEST FINDINGS Cardiovascular: Cardiomegaly. No pericardial effusion. Calcified aortic atherosclerosis. Mediastinum/Nodes: No mediastinal mass or lymphadenopathy. There is some central pulmonary artery enlargement bilaterally and this might account for the hilum appearance on the earlier portable chest. Lungs/Pleura: Major airways are patent. Symmetric dependent lung opacity is enhancing and most compatible with atelectasis. Some superimposed bilateral subpleural scarring. No pleural effusion or convincing acute pulmonary inflammation. Musculoskeletal: Osteopenia. Numerous chronic posterior and lateral rib fractures on the right side. Healed chronic sternal fracture suspected. No acute osseous abnormality identified. CT ABDOMEN PELVIS FINDINGS Hepatobiliary: Portal venous gas within the liver, primarily the left hepatic lobe. Dilated intra-and extrahepatic biliary tree but absent gallbladder. The CBD measures up to 19 mm diameter, and tapers distally. Questionable sludge layering in the distal CBD on series 4, image 55, no other obstructing etiology identified. Liver enhancement remains homogeneous.  No perihepatic free fluid. Pancreas: Atrophied pancreatic head and uncinate. No mass or inflammation. Spleen: Negative. Adrenals/Urinary Tract: Mild adrenal gland thickening such as due to hyperplasia. Symmetric renal enhancement. Bilateral renal parapelvic cysts are suspected (no follow-up imaging recommended). However, there is also a large obstructing stone at the right ureteropelvic junction (series 1, image 70 and sagittal image 83, which is up to 10 mm in length by 7 mm in with. Suspected right hydronephrosis, although fairly symmetric delayed  renal enhancement. Distal to the large stone the right ureter is decompressed. The left ureter appears decompressed throughout. There is mild right pararenal inflammatory stranding. Bladder is decompressed by Foley catheter. And there could be a small additional 5 mm stone within the bladder adjacent to the Foley. Incidental pelvic phleboliths. Stomach/Bowel: Oral contrast mixed with stool in redundant rectosigmoid colon. Sigmoid and descending colon diverticulosis, moderate to severe but no active inflammation. Intermittent diverticula in the transverse colon and at the right hepatic flexure without active inflammation. Abnormal mesenteric and portal venous branch vessel gas in the right lower quadrant (series 4, image 43 and series 1, image 89. This is surrounding the cecum and proximal ascending colon. The cecum is mildly dilated with gas up to 7 cm. The terminal ileum remains decompressed. Mild if any large bowel wall thickening or inflammation there. Appendix is not identified. Upstream small bowel is nondilated. Percutaneous gastrostomy tube in place with decompressed stomach. Decompressed duodenum. No pneumoperitoneum.  No free fluid identified. Vascular/Lymphatic: Portal venous gas in the superior mesenteric vein series 1, image 62. Portal venous system appears to remain patent. Aortoiliac calcified atherosclerosis. Negative for abdominal aortic aneurysm. Major arterial structures are patent. No lymphadenopathy identified. Reproductive: Surgically absent uterus. Diminutive or absent ovaries. Other: No free fluid. Musculoskeletal: Osteopenia with age indeterminate mild L4 and L5 compression fractures. Superimposed lumbar disc, endplate, facet degeneration. Medial left pubic symphysis deformity, possibly with superimposed bone cement. No acute or suspicious osseous changes identified there. IMPRESSION: 1. Positive for Portal Venous Gas in the liver, the  superior mesenteric vein, and in the mesentery  surrounding the Cecum And Proximal Ascending Colon. This is suspicious for proximal large bowel ischemia, however, there are little if any changes of colitis there and no convincing pneumatosis. No evidence of bowel obstruction or perforation (no pneumoperitoneum or free fluid). Percutaneous gastrostomy tube with no adverse features. 2. Superimposed right side obstructive uropathy with a large right UPJ 10 x 7 mm stone, right hydronephrosis and pararenal inflammation. Possible small 5 mm stone in the bladder which is decompressed by Foley catheter. 3. Dilated intra- and extrahepatic biliary tree with questionable sludge in the distal CBD. But absent gallbladder and no other obstructing etiology identified. 4. Cardiomegaly. Mild lower lobe atelectasis. Aortic Atherosclerosis (ICD10-I70.0). 5. Osteopenia with age indeterminate mild L4 and L5 compression fractures. Salient findings discussed by telephone with Dr. Ross Marcus on 04/30/2022 at 05:29 . Electronically Signed   By: Odessa Fleming M.D.   On: 05/21/2022 05:35   DG Chest Port 1 View  Result Date: 05/18/2022 CLINICAL DATA:  Shortness of breath. EXAM: PORTABLE CHEST 1 VIEW COMPARISON:  Chest radiograph dated 06/14/2021. FINDINGS: Right perihilar density, progressed since the prior radiograph. Although this may represent dilated vessels, underlying mass or adenopathy is not excluded. Further evaluation with chest CT is recommended. There is minimal bibasilar atelectasis. No pleural effusion or pneumothorax. Stable cardiomegaly. No acute osseous pathology. IMPRESSION: Slight progression of right perihilar density. Further evaluation with chest CT is recommended. Electronically Signed   By: Elgie Collard M.D.   On: 05/20/2022 03:06    Pending Labs Unresulted Labs (From admission, onward)     Start     Ordered   05/06/2022 0221  Urine Culture  Once,   URGENT       Question:  Indication  Answer:  Sepsis   05/16/2022 0220   05/11/2022 0220  Culture, blood  (Routine x 2)  BLOOD CULTURE X 2,   R,   Status:  Canceled      05/27/2022 0219            Vitals/Pain Today's Vitals   05/09/2022 1000 05/02/2022 1015 05/25/2022 1100 05/28/2022 1215  BP: 94/66 98/64 (!) 99/59 (!) 75/54  Pulse: (!) 150 (!) 132 (!) 150 (!) 136  Resp: 20 19 (!) 24 (!) 26  Temp:   98.3 F (36.8 C)   TempSrc:      SpO2: 97% 97% 97% 97%    Isolation Precautions No active isolations  Medications Medications  0.9 %  sodium chloride infusion (250 mLs Intravenous New Bag/Given 05/21/2022 0915)  0.9 %  sodium chloride infusion ( Intravenous New Bag/Given 05/30/2022 1219)  acetaminophen (TYLENOL) tablet 650 mg (has no administration in time range)    Or  acetaminophen (TYLENOL) suppository 650 mg (has no administration in time range)  glycopyrrolate (ROBINUL) tablet 1 mg (has no administration in time range)    Or  glycopyrrolate (ROBINUL) injection 0.2 mg (has no administration in time range)    Or  glycopyrrolate (ROBINUL) injection 0.2 mg (has no administration in time range)  polyvinyl alcohol (LIQUIFILM TEARS) 1.4 % ophthalmic solution 1 drop (has no administration in time range)  fentaNYL (SUBLIMAZE) bolus via infusion 100 mcg (has no administration in time range)  fentaNYL in NS (62mcg/ml) infusion-PREMIX (50 mcg/hr Intravenous New Bag/Given 05/11/2022 1221)  midazolam (VERSED) injection 2-4 mg (has no administration in time range)  ondansetron (ZOFRAN-ODT) disintegrating tablet 4 mg (has no administration in time range)    Or  ondansetron (ZOFRAN) injection 4 mg (has no administration in time range)  diphenhydrAMINE (BENADRYL) injection 25 mg (has no administration in time range)  haloperidol lactate (HALDOL) injection 5 mg (has no administration in time range)  lactated ringers bolus 1,000 mL (0 mLs Intravenous Stopped 05/15/2022 0332)    And  lactated ringers bolus 1,000 mL (0 mLs Intravenous Stopped 05/16/2022 0407)    And  lactated ringers bolus 1,000 mL (0  mLs Intravenous Stopped 05/08/2022 0514)  ceFEPIme (MAXIPIME) 2 g in sodium chloride 0.9 % 100 mL IVPB (0 g Intravenous Stopped 05/11/2022 0332)  metroNIDAZOLE (FLAGYL) IVPB 500 mg (0 mg Intravenous Stopped 05/15/2022 0407)  vancomycin (VANCOCIN) IVPB 1000 mg/200 mL premix (0 mg Intravenous Stopped 05/09/2022 0514)  potassium chloride 10 mEq in 100 mL IVPB (0 mEq Intravenous Stopped 05/03/2022 0633)  calcium gluconate 1 g/ 50 mL sodium chloride IVPB (0 mg Intravenous Stopped 05/15/2022 0702)  sodium chloride (PF) 0.9 % injection (  Given 05/16/2022 0422)  iohexol (OMNIPAQUE) 300 MG/ML solution 100 mL (100 mLs Intravenous Contrast Given 05/17/2022 0355)  lactated ringers bolus 1,000 mL (0 mLs Intravenous Stopped 05/18/2022 0611)  fentaNYL (SUBLIMAZE) injection 50 mcg (50 mcg Intravenous Given 05/10/2022 9373)    Mobility non-ambulatory     Focused Assessments     R Recommendations: See Admitting Provider Note  Report given to:   Additional Notes:

## 2022-05-23 NOTE — Progress Notes (Signed)
PCCM interval progress note:   Pt rounded on, MAP is low and pt is nearly unresponsive.  Multiple family members at the bedside, they confirm no escalation in care and understand that her condition is worsening. They want to be with her and want her to be comfortable, move to ICU when bed is available   Otilio Carpen Kynzee Devinney, PA-C

## 2022-05-23 NOTE — ED Notes (Signed)
DR aware of pt's vitals

## 2022-05-23 NOTE — ED Notes (Signed)
Family updated as to patient's status. Daughter [name] at bedside

## 2022-05-23 NOTE — ED Provider Notes (Addendum)
[facility] AT [facility] Provider Note   CSN: [id number] Arrival date & time: 05/11/2022  0203     History  Chief Complaint  Patient presents with   Code Sepsis    Sarah Boone is a 89 y.o. female.  HPI     This is an 89 year old female with a history of dementia who presents with fever of 101.8.  History obtained from EMS and chart review as well as discussions with the patient's daughter.  Patient is nonverbal.  Per daughter, she has not spoken much over the last 6 months.  She has had recent history of pneumonia and recurrent UTI.  She is also over the last 24 hours developed vomiting and reflux into her PEG tube which has been described as bilious.  Temperature of 101.8 prior to transfer to the ER today.  Daughter confirms DNR status.  Level 5 caveat.  Home Medications Prior to Admission medications   Medication Sig Start Date End Date Taking? Authorizing Provider  acetaminophen (TYLENOL) 325 MG tablet Place 650 mg into feeding tube every 6 (six) hours as needed for fever or moderate pain. 11/03/20  Yes [provider]  ascorbic acid (VITAMIN C) 500 MG tablet Place 500 mg into feeding tube daily.   Yes [provider]  CALCIUM + VITAMIN D3 600-10 MG-MCG TABS Give 1 tablet by tube daily. 11/03/20  Yes [provider]  cholestyramine Lucrezia Starch) 4 GM/DOSE powder Place 4 g into feeding tube daily as needed (loose stool). 12/19/20  Yes [provider]  clotrimazole-betamethasone (LOTRISONE) cream Apply topically 2 (two) times daily. 09/06/21 09/06/22 Yes [provider]  diclofenac Sodium (VOLTAREN) 1 % GEL Apply 2 g topically 2 (two) times daily as needed (pain right hip/knee). 11/08/20  Yes [provider]  diltiazem (CARDIZEM) 120 MG tablet Take 120 mg by mouth 3 (three) times daily. 12/25/20  Yes [provider]  estradiol (ESTRACE) 0.1 MG/GM vaginal cream Place 1 Applicatorful vaginally at  bedtime. 12/21/20  Yes [provider]  famotidine (PEPCID) 20 MG tablet Place 20 mg into feeding tube 2 (two) times daily.   Yes [provider]  furosemide (LASIX) 20 MG tablet 10 mg by Per J Tube route daily.   Yes [provider]  guaiFENesin (ROBITUSSIN) 100 MG/5ML liquid Take 20 mLs by mouth every 4 (four) hours as needed for cough or to loosen phlegm.   Yes [provider]  ipratropium-albuterol (DUONEB) 0.5-2.5 (3) MG/3ML SOLN Take 3 mLs by nebulization every 6 (six) hours as needed (cough, chest tightness). 04/26/21  Yes Hunsucker, Bonna Gains, MD  lactulose (CHRONULAC) 10 GM/15ML solution Place 15-30 g into feeding tube as directed. Take 15 ml daily and Take 45 mls daily PRN for constipation   Yes [provider]  levothyroxine (SYNTHROID) 200 MCG tablet Place 200 mcg into feeding tube daily. 12/28/20  Yes [provider]  Melatonin 5 MG CAPS Take 5 mg by mouth at bedtime. 12/30/20  Yes [provider]  metFORMIN (GLUCOPHAGE) 500 MG tablet Place 500 mg into feeding tube 2 (two) times daily with a meal.   Yes [provider]  methenamine (HIPREX) 1 g tablet Place 1 g into feeding tube 2 (two) times daily with a meal. UTI prophylaxis   Yes [provider]  mineral oil-hydrophilic petrolatum (AQUAPHOR) ointment Apply 1 Application topically in the morning and at bedtime. 02/10/21  Yes [provider]  nitroGLYCERIN (NITROSTAT) 0.4 MG SL tablet Place  0.4 mg under the tongue every 5 (five) minutes as needed for chest pain. 11/03/20  Yes [provider]  NOVOLOG FLEXPEN 100 UNIT/ML FlexPen Inject 2-8 Units into the skin 3 (three) times daily with meals. 11/14/20  Yes [provider]  [facility] powder Apply 1 Application topically 3 (three) times daily. Areas under breasts and skin folds 07/19/20  Yes [provider]  ondansetron (ZOFRAN-ODT) 4 MG disintegrating tablet Take 4 mg by mouth every 6  (six) hours as needed for nausea. 01/29/22  Yes [provider]  polyethylene glycol powder (GLYCOLAX/MIRALAX) 17 GM/SCOOP powder Take 17 g by mouth daily as needed for severe constipation. 11/03/20  Yes [provider]  Probiotic Product (BACID) CAPS Take 1 capsule by mouth daily. 12/07/20  Yes [provider]  SANTYL 250 UNIT/GM ointment Apply 1 Application topically daily. Apply to buttocks wound 04/03/22  Yes [provider]  sodium hypochlorite (DAKIN'S 1/2 STRENGTH) external solution Irrigate with 1 Application as directed 2 (two) times a week. On bath days and prn   Yes [provider]  traMADol (ULTRAM) 50 MG tablet Take 50 mg by mouth every 6 (six) hours as needed for moderate pain. 01/02/21  Yes [provider]  Infant Care Products (DERMACLOUD) OINT Apply 1 application  topically every 8 (eight) hours as needed (rash). Apply to buttocks wound every shift and prn soiling    [provider]      Allergies    Ciprofloxacin, Crestor [rosuvastatin], Hydrocodone, Latex, and Sulfadiazine    Review of Systems   Review of Systems  Unable to perform ROS: Patient nonverbal    Physical Exam Updated Vital Signs BP 103/67 (BP Location: Right Arm)   Pulse (!) 133   Temp 98.3 F (36.8 C) (Oral)   Resp (!) 23   SpO2 99%  Physical Exam Vitals and nursing note reviewed.  Constitutional:      Appearance: She is well-developed. She is obese.     Comments: Ill-appearing, elderly  HENT:     Head: Normocephalic and atraumatic.     Mouth/Throat:     Mouth: Mucous membranes are dry.  Eyes:     Pupils: Pupils are equal, round, and reactive to light.  Cardiovascular:     Rate and Rhythm: Regular rhythm. Tachycardia present.     Heart sounds: Normal heart sounds.  Pulmonary:     Effort: Pulmonary effort is normal. No respiratory distress.     Breath sounds: Wheezing present.  Abdominal:     General: Bowel sounds are normal.      Palpations: Abdomen is soft.     Tenderness: There is abdominal tenderness.     Comments: PEG tube in place, slight drainage noted around insertion site, no erythema or skin breakdown  Musculoskeletal:     Cervical back: Neck supple.     Right lower leg: Edema present.     Left lower leg: Edema present.  Skin:    General: Skin is warm and dry.  Neurological:     Mental Status: She is alert.     Comments: Unable to assess orientation, no facial droop, appears to move all 4 extremities spontaneously  Psychiatric:     Comments: Unable to assess     ED Results / Procedures / Treatments   Labs (all labs ordered are listed, but only abnormal results are displayed) Labs Reviewed  COMPREHENSIVE METABOLIC PANEL - Abnormal; Notable for the following components:      Result Value  Potassium 2.9 (*)    Chloride 112 (*)    CO2 18 (*)    Glucose, Bld 113 (*)    Creatinine, Ser 0.37 (*)    Calcium 5.6 (*)    Total Protein 3.9 (*)    Albumin 1.8 (*)    All other components within normal limits  LACTIC ACID, PLASMA - Abnormal; Notable for the following components:   Lactic Acid, Venous 5.3 (*)    All other components within normal limits  LACTIC ACID, PLASMA - Abnormal; Notable for the following components:   Lactic Acid, Venous 5.7 (*)    All other components within normal limits  CBC WITH DIFFERENTIAL/PLATELET - Abnormal; Notable for the following components:   WBC 31.3 (*)    Neutro Abs 29.5 (*)    Lymphs Abs 0.4 (*)    Abs Immature Granulocytes 0.54 (*)    All other components within normal limits  URINALYSIS, ROUTINE W REFLEX MICROSCOPIC - Abnormal; Notable for the following components:   APPearance CLOUDY (*)    Hgb urine dipstick LARGE (*)    Protein, ur 30 (*)    Leukocytes,Ua LARGE (*)    RBC / HPF >50 (*)    WBC, UA >50 (*)    Bacteria, UA MANY (*)    All other components within normal limits  BLOOD GAS, VENOUS - Abnormal; Notable for the following components:   pCO2,  Ven 42 (*)    pO2, Ven 49 (*)    Acid-Base Excess 2.4 (*)    All other components within normal limits  LACTIC ACID, PLASMA - Abnormal; Notable for the following components:   Lactic Acid, Venous 4.8 (*)    All other components within normal limits  I-STAT CHEM 8, ED - Abnormal; Notable for the following components:   Sodium 132 (*)    Chloride 96 (*)    BUN 26 (*)    Creatinine, Ser 0.40 (*)    Glucose, Bld 156 (*)    Calcium, Ion 1.06 (*)    All other components within normal limits  RESP PANEL BY RT-PCR (RSV, FLU A&B, COVID)  [id number]  CULTURE, BLOOD (ROUTINE X 2)  CULTURE, BLOOD (ROUTINE X 2)  URINE CULTURE  PROTIME-INR  APTT  LACTIC ACID, PLASMA    EKG EKG Interpretation  Date/Time:  Wednesday May 23 2022 04:24:24 EST Ventricular Rate:  119 PR Interval:    QRS Duration: 133 QT Interval:  316 QTC Calculation: 445 R Axis:   78 Text Interpretation: Atrial fibrillation Right bundle branch block Confirmed by Thayer Jew [phone]) on 04/30/2022 6:30:40 AM  Radiology CT ABDOMEN PELVIS W CONTRAST  Result Date: 05/08/2022 CLINICAL DATA:  89 year old female with fever of 101.8 F. Bile colored emesis. PEG tube. EXAM: CT CHEST, ABDOMEN, AND PELVIS WITH CONTRAST TECHNIQUE: Multidetector CT imaging of the chest, abdomen and pelvis was performed following the standard protocol during bolus administration of intravenous contrast. RADIATION DOSE REDUCTION: This exam was performed according to the departmental dose-optimization program which includes automated exposure control, adjustment of the mA and/or kV according to patient size and/or use of iterative reconstruction technique. CONTRAST:  160mL OMNIPAQUE IOHEXOL 300 MG/ML  SOLN COMPARISON:  Portable chest x-ray 0237 hours today. FINDINGS: CT CHEST FINDINGS Cardiovascular: Cardiomegaly. No pericardial effusion. Calcified aortic atherosclerosis. Mediastinum/Nodes: No mediastinal mass or lymphadenopathy. There is some central  pulmonary artery enlargement bilaterally and this might account for the hilum appearance on the earlier portable chest. Lungs/Pleura: Major airways are patent. Symmetric dependent lung  opacity is enhancing and most compatible with atelectasis. Some superimposed bilateral subpleural scarring. No pleural effusion or convincing acute pulmonary inflammation. Musculoskeletal: Osteopenia. Numerous chronic posterior and lateral rib fractures on the right side. Healed chronic sternal fracture suspected. No acute osseous abnormality identified. CT ABDOMEN PELVIS FINDINGS Hepatobiliary: Portal venous gas within the liver, primarily the left hepatic lobe. Dilated intra-and extrahepatic biliary tree but absent gallbladder. The CBD measures up to 19 mm diameter, and tapers distally. Questionable sludge layering in the distal CBD on series 4, image 55, no other obstructing etiology identified. Liver enhancement remains homogeneous.  No perihepatic free fluid. Pancreas: Atrophied pancreatic head and uncinate. No mass or inflammation. Spleen: Negative. Adrenals/Urinary Tract: Mild adrenal gland thickening such as due to hyperplasia. Symmetric renal enhancement. Bilateral renal parapelvic cysts are suspected (no follow-up imaging recommended). However, there is also a large obstructing stone at the right ureteropelvic junction (series 1, image 70 and sagittal image 83, which is up to 10 mm in length by 7 mm in with. Suspected right hydronephrosis, although fairly symmetric delayed renal enhancement. Distal to the large stone the right ureter is decompressed. The left ureter appears decompressed throughout. There is mild right pararenal inflammatory stranding. Bladder is decompressed by Foley catheter. And there could be a small additional 5 mm stone within the bladder adjacent to the Foley. Incidental pelvic phleboliths. Stomach/Bowel: Oral contrast mixed with stool in redundant rectosigmoid colon. Sigmoid and descending colon  diverticulosis, moderate to severe but no active inflammation. Intermittent diverticula in the transverse colon and at the right hepatic flexure without active inflammation. Abnormal mesenteric and portal venous branch vessel gas in the right lower quadrant (series 4, image 43 and series 1, image 89. This is surrounding the cecum and proximal ascending colon. The cecum is mildly dilated with gas up to 7 cm. The terminal ileum remains decompressed. Mild if any large bowel wall thickening or inflammation there. Appendix is not identified. Upstream small bowel is nondilated. Percutaneous gastrostomy tube in place with decompressed stomach. Decompressed duodenum. No pneumoperitoneum.  No free fluid identified. Vascular/Lymphatic: Portal venous gas in the superior mesenteric vein series 1, image 62. Portal venous system appears to remain patent. Aortoiliac calcified atherosclerosis. Negative for abdominal aortic aneurysm. Major arterial structures are patent. No lymphadenopathy identified. Reproductive: Surgically absent uterus. Diminutive or absent ovaries. Other: No free fluid. Musculoskeletal: Osteopenia with age indeterminate mild L4 and L5 compression fractures. Superimposed lumbar disc, endplate, facet degeneration. Medial left pubic symphysis deformity, possibly with superimposed bone cement. No acute or suspicious osseous changes identified there. IMPRESSION: 1. Positive for Portal Venous Gas in the liver, the superior mesenteric vein, and in the mesentery surrounding the Cecum And Proximal Ascending Colon. This is suspicious for proximal large bowel ischemia, however, there are little if any changes of colitis there and no convincing pneumatosis. No evidence of bowel obstruction or perforation (no pneumoperitoneum or free fluid). Percutaneous gastrostomy tube with no adverse features. 2. Superimposed right side obstructive uropathy with a large right UPJ 10 x 7 mm stone, right hydronephrosis and pararenal  inflammation. Possible small 5 mm stone in the bladder which is decompressed by Foley catheter. 3. Dilated intra- and extrahepatic biliary tree with questionable sludge in the distal CBD. But absent gallbladder and no other obstructing etiology identified. 4. Cardiomegaly. Mild lower lobe atelectasis. Aortic Atherosclerosis (ICD10-I70.0). 5. Osteopenia with age indeterminate mild L4 and L5 compression fractures. Salient findings discussed by telephone with Dr. Ross Marcus on 06-12-2022 at 05:29 . Electronically Signed  By: Genevie Ann M.D.   On: 2022/06/19 05:35   CT Chest W Contrast  Result Date: 06/19/2022 CLINICAL DATA:  89 year old female with fever of 101.8 F. Bile colored emesis. PEG tube. EXAM: CT CHEST, ABDOMEN, AND PELVIS WITH CONTRAST TECHNIQUE: Multidetector CT imaging of the chest, abdomen and pelvis was performed following the standard protocol during bolus administration of intravenous contrast. RADIATION DOSE REDUCTION: This exam was performed according to the departmental dose-optimization program which includes automated exposure control, adjustment of the mA and/or kV according to patient size and/or use of iterative reconstruction technique. CONTRAST:  134mL OMNIPAQUE IOHEXOL 300 MG/ML  SOLN COMPARISON:  Portable chest x-ray 0237 hours today. FINDINGS: CT CHEST FINDINGS Cardiovascular: Cardiomegaly. No pericardial effusion. Calcified aortic atherosclerosis. Mediastinum/Nodes: No mediastinal mass or lymphadenopathy. There is some central pulmonary artery enlargement bilaterally and this might account for the hilum appearance on the earlier portable chest. Lungs/Pleura: Major airways are patent. Symmetric dependent lung opacity is enhancing and most compatible with atelectasis. Some superimposed bilateral subpleural scarring. No pleural effusion or convincing acute pulmonary inflammation. Musculoskeletal: Osteopenia. Numerous chronic posterior and lateral rib fractures on the right side. Healed  chronic sternal fracture suspected. No acute osseous abnormality identified. CT ABDOMEN PELVIS FINDINGS Hepatobiliary: Portal venous gas within the liver, primarily the left hepatic lobe. Dilated intra-and extrahepatic biliary tree but absent gallbladder. The CBD measures up to 19 mm diameter, and tapers distally. Questionable sludge layering in the distal CBD on series 4, image 55, no other obstructing etiology identified. Liver enhancement remains homogeneous.  No perihepatic free fluid. Pancreas: Atrophied pancreatic head and uncinate. No mass or inflammation. Spleen: Negative. Adrenals/Urinary Tract: Mild adrenal gland thickening such as due to hyperplasia. Symmetric renal enhancement. Bilateral renal parapelvic cysts are suspected (no follow-up imaging recommended). However, there is also a large obstructing stone at the right ureteropelvic junction (series 1, image 70 and sagittal image 83, which is up to 10 mm in length by 7 mm in with. Suspected right hydronephrosis, although fairly symmetric delayed renal enhancement. Distal to the large stone the right ureter is decompressed. The left ureter appears decompressed throughout. There is mild right pararenal inflammatory stranding. Bladder is decompressed by Foley catheter. And there could be a small additional 5 mm stone within the bladder adjacent to the Foley. Incidental pelvic phleboliths. Stomach/Bowel: Oral contrast mixed with stool in redundant rectosigmoid colon. Sigmoid and descending colon diverticulosis, moderate to severe but no active inflammation. Intermittent diverticula in the transverse colon and at the right hepatic flexure without active inflammation. Abnormal mesenteric and portal venous branch vessel gas in the right lower quadrant (series 4, image 43 and series 1, image 89. This is surrounding the cecum and proximal ascending colon. The cecum is mildly dilated with gas up to 7 cm. The terminal ileum remains decompressed. Mild if any large  bowel wall thickening or inflammation there. Appendix is not identified. Upstream small bowel is nondilated. Percutaneous gastrostomy tube in place with decompressed stomach. Decompressed duodenum. No pneumoperitoneum.  No free fluid identified. Vascular/Lymphatic: Portal venous gas in the superior mesenteric vein series 1, image 62. Portal venous system appears to remain patent. Aortoiliac calcified atherosclerosis. Negative for abdominal aortic aneurysm. Major arterial structures are patent. No lymphadenopathy identified. Reproductive: Surgically absent uterus. Diminutive or absent ovaries. Other: No free fluid. Musculoskeletal: Osteopenia with age indeterminate mild L4 and L5 compression fractures. Superimposed lumbar disc, endplate, facet degeneration. Medial left pubic symphysis deformity, possibly with superimposed bone cement. No acute or suspicious osseous changes identified  there. IMPRESSION: 1. Positive for Portal Venous Gas in the liver, the superior mesenteric vein, and in the mesentery surrounding the Cecum And Proximal Ascending Colon. This is suspicious for proximal large bowel ischemia, however, there are little if any changes of colitis there and no convincing pneumatosis. No evidence of bowel obstruction or perforation (no pneumoperitoneum or free fluid). Percutaneous gastrostomy tube with no adverse features. 2. Superimposed right side obstructive uropathy with a large right UPJ 10 x 7 mm stone, right hydronephrosis and pararenal inflammation. Possible small 5 mm stone in the bladder which is decompressed by Foley catheter. 3. Dilated intra- and extrahepatic biliary tree with questionable sludge in the distal CBD. But absent gallbladder and no other obstructing etiology identified. 4. Cardiomegaly. Mild lower lobe atelectasis. Aortic Atherosclerosis (ICD10-I70.0). 5. Osteopenia with age indeterminate mild L4 and L5 compression fractures. Salient findings discussed by telephone with Dr. Thayer Jew on 05/03/2022 at 05:29 . Electronically Signed   By: Genevie Ann M.D.   On: 05/18/2022 05:35   DG Chest Port 1 View  Result Date: 05/07/2022 CLINICAL DATA:  Shortness of breath. EXAM: PORTABLE CHEST 1 VIEW COMPARISON:  Chest radiograph dated 06/14/2021. FINDINGS: Right perihilar density, progressed since the prior radiograph. Although this may represent dilated vessels, underlying mass or adenopathy is not excluded. Further evaluation with chest CT is recommended. There is minimal bibasilar atelectasis. No pleural effusion or pneumothorax. Stable cardiomegaly. No acute osseous pathology. IMPRESSION: Slight progression of right perihilar density. Further evaluation with chest CT is recommended. Electronically Signed   By: Anner Crete M.D.   On: 05/05/2022 03:06    Procedures .Critical Care  Performed by: Merryl Hacker, MD Authorized by: Merryl Hacker, MD   Critical care provider statement:    Critical care time (minutes):  90   Critical care was necessary to treat or prevent imminent or life-threatening deterioration of the following conditions:  Sepsis and shock   Critical care was time spent personally by me on the following activities:  Development of treatment plan with patient or surrogate, discussions with consultants, evaluation of patient's response to treatment, examination of patient, ordering and review of laboratory studies, ordering and review of radiographic studies, ordering and performing treatments and interventions, pulse oximetry, re-evaluation of patient's condition and review of old charts     Medications Ordered in ED Medications  lactated ringers infusion ( Intravenous New Bag/Given 05/27/2022 0710)  norepinephrine (LEVOPHED) 4mg  in 211mL (0.016 mg/mL) premix infusion (3 mcg/min Intravenous Rate/Dose Verify 05/13/2022 0710)  lactated ringers bolus 1,000 mL (0 mLs Intravenous Stopped 05/30/2022 0332)    And  lactated ringers bolus 1,000 mL (0 mLs Intravenous  Stopped 05/13/2022 0407)    And  lactated ringers bolus 1,000 mL (0 mLs Intravenous Stopped 05/01/2022 0514)  ceFEPIme (MAXIPIME) 2 g in sodium chloride 0.9 % 100 mL IVPB (0 g Intravenous Stopped 05/19/2022 0332)  metroNIDAZOLE (FLAGYL) IVPB 500 mg (0 mg Intravenous Stopped 05/11/2022 0407)  vancomycin (VANCOCIN) IVPB 1000 mg/200 mL premix (0 mg Intravenous Stopped 05/22/2022 0514)  potassium chloride 10 mEq in 100 mL IVPB (0 mEq Intravenous Stopped 04/30/2022 0633)  calcium gluconate 1 g/ 50 mL sodium chloride IVPB (0 mg Intravenous Stopped 05/01/2022 0702)  sodium chloride (PF) 0.9 % injection (  Given 05/01/2022 0422)  iohexol (OMNIPAQUE) 300 MG/ML solution 100 mL (100 mLs Intravenous Contrast Given 05/10/2022 0355)  lactated ringers bolus 1,000 mL (0 mLs Intravenous Stopped 05/25/2022 0611)  fentaNYL (SUBLIMAZE) injection 50 mcg (50 mcg Intravenous  Given 05/30/2022 [id number])    ED Course/ Medical Decision Making/ A&P Clinical Course as of 05/04/2022 0731  Wed May 23, 2022  0238 Spoke with the patient's daughter who who is her power of attorney.  Indicates that her mother has had general deterioration over the last several months.  Has not talked much in that time period.  She confirms DNR status.  She would like antibiotics and IV fluids.  Reports recent history of UTI and pneumonia although states that her mother came off antibiotics last week. [CH]  0505 I spoke with the patient's daughter again, Bonnita Nasuti.  Discussed with her that I feared that her mother was in septic shock.  She has refractory hypotension despite fluid resuscitation.  We discussed vasopressors and central line placement.  She states that she does not believe that her mother would want a central line; however, she would like temporary peripheral vasopressors until there is more information to make decisions.  I stressed to her that her mother was very ill.  She stated understanding. [CH]  0606 I had a long discussion with the patient's daughter.  I highly suspect  the CT findings are consistent with ischemic bowel especially given uptrending lactate and patient's clinical presentation.  Also with infected obstructing stone.  I discussed with the daughter options.  She discussed with her sister.  They both requested that their mother would like to be comfortable over anything else.  However, they would like to see her.  Decision was made to not escalate care.  She is currently on 2 of Levophed.  Will hold the course but will not escalate care and will continue to focus on comfort. [CH]  [phone] Spoke to the hospitalist.  Given that the patient is on vasopressors, he recommends ICU evaluation until and if patient fully transitions to comfort measures. [CH]  [phone] With Dr. Patsey Berthold, critical care.  She will touch base with urology but agrees that likely no intervention would be offered.  Will also touch base with general surgery.  Will await daughter's arrival.  If transition to full comfort, can be a medicine admission. [CH]  [phone] Spoke with Dr. Redmond Pulling, general surgery.  Patient would not be a surgical candidate at this time. [CH]  0728 On recheck, patient remains tachy. SBP 103.  She clinically is more alert.  Dr. Patsey Berthold updated.  Maybe be a candidate for nephrostomy tube. [CH]    Clinical Course User Index [CH] Catharina Pica, Barbette Hair, MD                             Medical Decision Making Amount and/or Complexity of Data Reviewed Labs: ordered. Radiology: ordered.  Risk Prescription drug management. Decision regarding hospitalization.   This patient presents to the ED for concern of fever, sepsis, this involves an extensive number of treatment options, and is a complaint that carries with it a high risk of complications and morbidity.  I considered the following differential and admission for this acute, potentially life threatening condition.  The differential diagnosis includes UTI, pneumonia, bowel ischemia given abdominal pain, SBO  MDM:    This is an  89 year old female who presents with concerns for sepsis.  Notably febrile at her facility.  Recent history of pneumonia and UTI per the daughter.  She is DNR, DNI.  She is not febrile but she is tachycardic and in atrial fibrillation.  Blood pressures low 90/65.  Patient was initiated on sepsis protocol.  She does have some tenderness to the abdomen on exam.  She is not actively vomiting.  She does not provide any history.  Labs notable for leukocytosis to 30.  She has hypokalemia with a potassium of 2.9.  Calcium of 5.6.  These were replaced.  Lactate is 5.3.  She was given a total of 3 L of lactated Ringer's without improvement of her blood pressure.  She was given a 4th L.  She was given broad-spectrum antibiotics including vancomycin, cefepime, and Flagyl.  She appears to be in septic shock.  CT chest abdomen and pelvis ordered.  I received a call from the radiologist regarding CT findings.  CT is highly suspicious for ischemic gut although the colon itself does not look significantly edematous.  She has portal venous gas as well as mesenteric gas.  In addition she has an obstructing right UVJ stone.  Urinalysis is consistent with UTI.  She has a chronic indwelling catheter.  See clinical course above and multiple conversations with the patient's daughter.  She confirmed goals of care.  After multiple conversations, we will hold the course without escalation of care in order for the daughters to come to the bedside.  Ultimately likely will make her comfort care.  I spoke with critical care and will discuss with general surgery.  Critical care to touch base with urology.  Doubt surgical intervention is an option.  (Labs, imaging, consults)  Labs: I Ordered, and personally interpreted labs.  The pertinent results include: CBC, CMP, lactate, PT/INR, blood cultures  Imaging Studies ordered: I ordered imaging studies including chest x-ray, CT chest, abdomen, pelvis I independently visualized and interpreted  imaging. I agree with the radiologist interpretation  Additional history obtained from daughter over the phone, EMS.  External records from outside source obtained and reviewed including prior evaluations  Cardiac Monitoring: The patient was maintained on a cardiac monitor.  I personally viewed and interpreted the cardiac monitored which showed an underlying rhythm of: Atrial fibrillation  Reevaluation: After the interventions noted above, I reevaluated the patient and found that they have :worsened  Social Determinants of Health:  lives in facility  Disposition: Admission, likely transition to comfort care  Co morbidities that complicate the patient evaluation  Past Medical History:  Diagnosis Date   Anxiety    Arthritis    Atrial fibrillation ([place])    Chronic respiratory failure (HCC)    Constipation    Dementia ([place])    Esophageal stenosis status post PEG placement    Gastritis    Hiatal hernia    Hyperlipidemia    Hypertension    Hyponatremia    Hypothyroidism    Insomnia    Oxygen dependent    Type 2 diabetes mellitus ([place])      Medicines Meds ordered this encounter  Medications   lactated ringers infusion   AND Linked Order Group    lactated ringers bolus 1,000 mL     Order Specific Question:   Enter Patient Weight in Kilograms     Answer:   83    lactated ringers bolus 1,000 mL     Order Specific Question:   Enter Patient Weight in Kilograms     Answer:   83    lactated ringers bolus 1,000 mL     Order Specific Question:   Enter Patient Weight in Kilograms     Answer:   83   ceFEPIme (MAXIPIME) 2 g in sodium chloride 0.9 % 100 mL IVPB  Order Specific Question:   Antibiotic Indication:    Answer:   Other Indication (list below)    Order Specific Question:   Other Indication:    Answer:   Unknown source   metroNIDAZOLE (FLAGYL) IVPB 500 mg    Order Specific Question:   Antibiotic Indication:    Answer:   Other Indication (list below)    Order  Specific Question:   Other Indication:    Answer:   Unknown source   vancomycin (VANCOCIN) IVPB 1000 mg/200 mL premix    Order Specific Question:   Indication:    Answer:   Other Indication (list below)    Order Specific Question:   Other Indication:    Answer:   Unknown source   potassium chloride 10 mEq in 100 mL IVPB   calcium gluconate 1 g/ 50 mL sodium chloride IVPB   sodium chloride (PF) 0.9 % injection    Terence Lux, Lori M: cabinet override   iohexol (OMNIPAQUE) 300 MG/ML solution 100 mL   lactated ringers bolus 1,000 mL   norepinephrine (LEVOPHED) 4mg  in 264mL (0.016 mg/mL) premix infusion   fentaNYL (SUBLIMAZE) injection 50 mcg    I have reviewed the patients home medicines and have made adjustments as needed  Problem List / ED Course: Problem List Items Addressed This Visit       Genitourinary   UTI (urinary tract infection)   Other Visit Diagnoses     Septic shock ([place])    -  Primary   Relevant Medications   ceFEPIme (MAXIPIME) 2 g in sodium chloride 0.9 % 100 mL IVPB (Completed)   metroNIDAZOLE (FLAGYL) IVPB 500 mg (Completed)   vancomycin (VANCOCIN) IVPB 1000 mg/200 mL premix (Completed)   Hypokalemia       Hypocalcemia       Ischemic bowel disease (HCC)       Relevant Medications   furosemide (LASIX) 20 MG tablet   norepinephrine (LEVOPHED) 4mg  in 260mL (0.016 mg/mL) premix infusion   Kidney stone       Relevant Medications   fentaNYL (SUBLIMAZE) injection 50 mcg (Completed)   Longstanding persistent atrial fibrillation (HCC)       Relevant Medications   furosemide (LASIX) 20 MG tablet   norepinephrine (LEVOPHED) 4mg  in 289mL (0.016 mg/mL) premix infusion                   Final Clinical Impression(s) / ED Diagnoses Final diagnoses:  Septic shock ([place])  Urinary tract infection associated with indwelling urethral catheter, initial encounter ([place])  Hypokalemia  Hypocalcemia  Ischemic bowel disease ([place])  Kidney stone  Longstanding  persistent atrial fibrillation [facility])    Rx / DC Orders ED Discharge Orders     None         Dajanae Brophy, Barbette Hair, MD 05/21/2022 [id number]    Merryl Hacker, MD 05/14/2022 [id number]    Merryl Hacker, MD 05/03/[id number]

## 2022-05-23 NOTE — ED Notes (Signed)
Pt family at bedside

## 2022-05-23 NOTE — Progress Notes (Signed)
Chaplain provided prayer and support to Sarah Boone's family as the bedside.  Chaplain engaged in a narrative/life review with them.  Her daughters shared that Sarah Boone is originally from [place] and came to [facility] in her 68's.  Her faith and family have been important to her.  Sarah Boone was also described as being very imaginative, creative, full of laughter, fun, and loving.  Her daughter said her mom has "a heart of gold."    Chaplain uplifted and affirmed their love of Sarah Boone. Chaplain is available to follow-up as needed.     2022/06/01 1400  Spiritual Encounters  Type of Visit Initial  Care provided to: Pt and family  Referral source Family;Nurse (RN/NT/LPN)  Reason for visit End-of-life  Spiritual Framework  Presenting Themes Rituals and practive;Impactful experiences and emotions  Community/Connection Family  Interventions  Spiritual Care Interventions Made Prayer;Supported grief process;Compassionate presence;Established relationship of care and support  Intervention Outcomes  Outcomes Awareness around self/spiritual resourses;Connection to spiritual care

## 2022-05-23 NOTE — ED Notes (Signed)
Noted pt had large BM. This Probation officer and NT Medical laboratory scientific officer. Pt given a clean brief. Pt repositioned and laying comfortable in bed.

## 2022-05-23 NOTE — Progress Notes (Signed)
PHARMACY - PHYSICIAN COMMUNICATION CRITICAL VALUE ALERT - BLOOD CULTURE IDENTIFICATION (BCID)  Sarah Boone is an 89 y.o. female who presented to [facility] on 05/01/2022 with a chief complaint of sepsis  Assessment:  1/2 BCx growing enterococcus, proteus, and MRSE (likely urinary source with known infected urolithiasis)  Name of physician (or Provider) Contacted: Ogan  Current antibiotics: none; patient is full comfort care  Changes to prescribed antibiotics recommended: No further action needed; CCM notified and in agreement.   Results for orders placed or performed during the hospital encounter of 05/03/2022  Blood Culture ID Panel (Reflexed) (Collected: 05/03/2022  2:25 AM)  Result Value Ref Range   Enterococcus faecalis DETECTED (A) NOT DETECTED   Enterococcus Faecium NOT DETECTED NOT DETECTED   Listeria monocytogenes NOT DETECTED NOT DETECTED   Staphylococcus species DETECTED (A) NOT DETECTED   Staphylococcus aureus (BCID) NOT DETECTED NOT DETECTED   Staphylococcus epidermidis DETECTED (A) NOT DETECTED   Staphylococcus lugdunensis NOT DETECTED NOT DETECTED   Streptococcus species NOT DETECTED NOT DETECTED   Streptococcus agalactiae NOT DETECTED NOT DETECTED   Streptococcus pneumoniae NOT DETECTED NOT DETECTED   Streptococcus pyogenes NOT DETECTED NOT DETECTED   A.calcoaceticus-baumannii NOT DETECTED NOT DETECTED   Bacteroides fragilis NOT DETECTED NOT DETECTED   Enterobacterales DETECTED (A) NOT DETECTED   Enterobacter cloacae complex NOT DETECTED NOT DETECTED   Escherichia coli NOT DETECTED NOT DETECTED   Klebsiella aerogenes NOT DETECTED NOT DETECTED   Klebsiella oxytoca NOT DETECTED NOT DETECTED   Klebsiella pneumoniae NOT DETECTED NOT DETECTED   Proteus species DETECTED (A) NOT DETECTED   Salmonella species NOT DETECTED NOT DETECTED   Serratia marcescens NOT DETECTED NOT DETECTED   Haemophilus influenzae NOT DETECTED NOT DETECTED   Neisseria meningitidis NOT DETECTED  NOT DETECTED   Pseudomonas aeruginosa NOT DETECTED NOT DETECTED   Stenotrophomonas maltophilia NOT DETECTED NOT DETECTED   Candida albicans NOT DETECTED NOT DETECTED   Candida auris NOT DETECTED NOT DETECTED   Candida glabrata NOT DETECTED NOT DETECTED   Candida krusei NOT DETECTED NOT DETECTED   Candida parapsilosis NOT DETECTED NOT DETECTED   Candida tropicalis NOT DETECTED NOT DETECTED   Cryptococcus neoformans/gattii NOT DETECTED NOT DETECTED   CTX-M ESBL NOT DETECTED NOT DETECTED   Carbapenem resistance IMP NOT DETECTED NOT DETECTED   Carbapenem resistance KPC NOT DETECTED NOT DETECTED   Methicillin resistance mecA/C DETECTED (A) NOT DETECTED   Carbapenem resistance NDM NOT DETECTED NOT DETECTED   Carbapenem resist OXA 48 LIKE NOT DETECTED NOT DETECTED   Vancomycin resistance NOT DETECTED NOT DETECTED   Carbapenem resistance VIM NOT DETECTED NOT DETECTED    [name] A 05/06/2022  8:54 PM

## 2022-05-23 NOTE — Consult Note (Signed)
Urology Consult   Physician requesting consult: Ezequiel Essex, MD  Reason for consult: Sepsis, obstructing ureteral calculus  History of Present Illness: Sarah Boone is a 89 y.o. female with multiple medical problems including dementia who presented to the emergency room with a fever to 101.8.  He has a history of recent pneumonia and has had recurrent UTIs.  Within the past 24 hours she developed vomiting and a fever.  She was found to meet criteria for sepsis on evaluation in the emergency room.  Urinalysis showed >50 RBCs, >50 WBCs, and many bacteria. CT imaging demonstrated a 10 x 7 mm stone at the right UPJ with associated hydronephrosis.  She was also noted to have portal venous gas. Consultation was requested for possible management of the obstructing stone with sepsis. The patient's family elected to proceed with comfort care only and did not want any intervention for the obstructing stone.   Past Medical History:  Diagnosis Date   Anxiety    Arthritis    Atrial fibrillation (HCC)    Chronic respiratory failure (HCC)    Constipation    Dementia (HCC)    Esophageal stenosis status post PEG placement    Gastritis    Hiatal hernia    Hyperlipidemia    Hypertension    Hyponatremia    Hypothyroidism    Insomnia    Oxygen dependent    Type 2 diabetes mellitus ([place])     Past Surgical History:  Procedure Laterality Date   ABDOMINAL HYSTERECTOMY     APPENDECTOMY     CHOLECYSTECTOMY     IR PATIENT EVAL TECH 0-60 MINS  03/10/2021   THYROID SURGERY     TONSILLECTOMY     vaginal polyps excision      Medications:  Scheduled Meds: Continuous Infusions:  sodium chloride 250 mL (05/01/2022 0915)   sodium chloride 20 mL/hr at 05/07/2022 1219   fentaNYL infusion INTRAVENOUS 50 mcg/hr (05/20/2022 1221)   PRN Meds:.acetaminophen **OR** acetaminophen, diphenhydrAMINE, fentaNYL, glycopyrrolate **OR** glycopyrrolate **OR** glycopyrrolate, haloperidol lactate, midazolam, ondansetron  **OR** ondansetron (ZOFRAN) IV, polyvinyl alcohol  Allergies:  Allergies  Allergen Reactions   Ciprofloxacin     Generic    Crestor [Rosuvastatin]    Hydrocodone    Latex    Sulfadiazine     Family History  Problem Relation Age of Onset   Angina Mother    Heart disease Father    Diabetes Father    Diabetes Sister    Diabetes Brother    Fibromyalgia Daughter    Diabetes Daughter    Arthritis Daughter    Irritable bowel syndrome Daughter    Hypertension Daughter    Diabetes Daughter     Social History:  reports that she has never smoked. She has never used smokeless tobacco. She reports that she does not currently use alcohol. She reports that she does not use drugs.  ROS: Not performed  Physical Exam:  Vital signs in last 24 hours: Temp:  [98.2 F (36.8 C)-98.9 F (37.2 C)] 98.3 F (36.8 C) (01/24 1353) Pulse Rate:  [56-157] 91 (01/24 1353) Resp:  [17-26] 22 (01/24 1353) BP: (59-125)/(48-81) 59/49 (01/24 1353) SpO2:  [96 %-99 %] 98 % (01/24 1300) No exam performed  Laboratory Data:  Recent Labs    05/15/2022 0216 05/15/2022 0308  WBC 31.3*  --   HGB 12.8 13.6  HCT 39.9 40.0  PLT 195  --     Recent Labs    05/07/2022 0216 05/14/2022 0308  NA 137  132*  K 2.9* 4.2  CL 112* 96*  GLUCOSE 113* 156*  BUN 19 26*  CALCIUM 5.6*  --   CREATININE 0.37* 0.40*     Results for orders placed or performed during the hospital encounter of May 26, 2022 (from the past 24 hour(s))  Comprehensive metabolic panel     Status: Abnormal   Collection Time: 05/10/2022  2:16 AM  Result Value Ref Range   Sodium 137 135 - 145 mmol/L   Potassium 2.9 (L) 3.5 - 5.1 mmol/L   Chloride 112 (H) 98 - 111 mmol/L   CO2 18 (L) 22 - 32 mmol/L   Glucose, Bld 113 (H) 70 - 99 mg/dL   BUN 19 8 - 23 mg/dL   Creatinine, Ser 1.61 (L) 0.44 - 1.00 mg/dL   Calcium 5.6 (LL) 8.9 - 10.3 mg/dL   Total Protein 3.9 (L) 6.5 - 8.1 g/dL   Albumin 1.8 (L) 3.5 - 5.0 g/dL   AST 27 15 - 41 U/L   ALT 13 0 - 44  U/L   Alkaline Phosphatase 63 38 - 126 U/L   Total Bilirubin 0.3 0.3 - 1.2 mg/dL   GFR, Estimated >09 >60 mL/min   Anion gap 7 5 - 15  Lactic acid, plasma     Status: Abnormal   Collection Time: May 26, 2022  2:16 AM  Result Value Ref Range   Lactic Acid, Venous 5.3 (HH) 0.5 - 1.9 mmol/L  CBC with Differential     Status: Abnormal   Collection Time: 05/11/2022  2:16 AM  Result Value Ref Range   WBC 31.3 (H) 4.0 - 10.5 K/uL   RBC 4.58 3.87 - 5.11 MIL/uL   Hemoglobin 12.8 12.0 - 15.0 g/dL   HCT 45.4 09.8 - 11.9 %   MCV 87.1 80.0 - 100.0 fL   MCH 27.9 26.0 - 34.0 pg   MCHC 32.1 30.0 - 36.0 g/dL   RDW 14.7 82.9 - 56.2 %   Platelets 195 150 - 400 K/uL   nRBC 0.0 0.0 - 0.2 %   Neutrophils Relative % 94 %   Neutro Abs 29.5 (H) 1.7 - 7.7 K/uL   Lymphocytes Relative 1 %   Lymphs Abs 0.4 (L) 0.7 - 4.0 K/uL   Monocytes Relative 3 %   Monocytes Absolute 0.8 0.1 - 1.0 K/uL   Eosinophils Relative 0 %   Eosinophils Absolute 0.0 0.0 - 0.5 K/uL   Basophils Relative 0 %   Basophils Absolute 0.1 0.0 - 0.1 K/uL   WBC Morphology VACUOLATED NEUTROPHILS    Immature Granulocytes 2 %   Abs Immature Granulocytes 0.54 (H) 0.00 - 0.07 K/uL  Protime-INR     Status: None   Collection Time: 05/28/2022  2:16 AM  Result Value Ref Range   Prothrombin Time 14.6 11.4 - 15.2 seconds   INR 1.2 0.8 - 1.2  Blood gas, venous (at [facility] and [facility])     Status: Abnormal   Collection Time: 05/05/2022  2:16 AM  Result Value Ref Range   pH, Ven 7.42 7.25 - 7.43   pCO2, Ven 42 (L) 44 - 60 mmHg   pO2, Ven 49 (H) 32 - 45 mmHg   Bicarbonate 27.2 20.0 - 28.0 mmol/L   Acid-Base Excess 2.4 (H) 0.0 - 2.0 mmol/L   O2 Saturation 81.5 %   Patient temperature 37.0   APTT     Status: None   Collection Time: 05/28/2022  2:16 AM  Result Value Ref Range   aPTT 25 24 -  36 seconds  Urinalysis, Routine w reflex microscopic     Status: Abnormal   Collection Time: 05/25/2022  2:20 AM  Result Value Ref Range   Color, Urine YELLOW YELLOW    APPearance CLOUDY (A) CLEAR   Specific Gravity, Urine 1.015 1.005 - 1.030   pH 7.0 5.0 - 8.0   Glucose, UA NEGATIVE NEGATIVE mg/dL   Hgb urine dipstick LARGE (A) NEGATIVE   Bilirubin Urine NEGATIVE NEGATIVE   Ketones, ur NEGATIVE NEGATIVE mg/dL   Protein, ur 30 (A) NEGATIVE mg/dL   Nitrite NEGATIVE NEGATIVE   Leukocytes,Ua LARGE (A) NEGATIVE   RBC / HPF >50 (H) 0 - 5 RBC/hpf   WBC, UA >50 (H) 0 - 5 WBC/hpf   Bacteria, UA MANY (A) NONE SEEN   Squamous Epithelial / HPF 0-5 0 - 5 /HPF   WBC Clumps PRESENT    Mucus PRESENT    Budding Yeast PRESENT   Resp panel by RT-PCR (RSV, Flu A&B, Covid) Anterior Nasal Swab     Status: None   Collection Time: 05/11/2022  2:36 AM   Specimen: Anterior Nasal Swab  Result Value Ref Range   SARS Coronavirus 2 by RT PCR NEGATIVE NEGATIVE   Influenza A by PCR NEGATIVE NEGATIVE   Influenza B by PCR NEGATIVE NEGATIVE   Resp Syncytial Virus by PCR NEGATIVE NEGATIVE  I-stat chem 8, ED (not at [facility], [facility] or [facility])     Status: Abnormal   Collection Time: 05/25/2022  3:08 AM  Result Value Ref Range   Sodium 132 (L) 135 - 145 mmol/L   Potassium 4.2 3.5 - 5.1 mmol/L   Chloride 96 (L) 98 - 111 mmol/L   BUN 26 (H) 8 - 23 mg/dL   Creatinine, Ser 0.40 (L) 0.44 - 1.00 mg/dL   Glucose, Bld 156 (H) 70 - 99 mg/dL   Calcium, Ion 1.06 (L) 1.15 - 1.40 mmol/L   TCO2 26 22 - 32 mmol/L   Hemoglobin 13.6 12.0 - 15.0 g/dL   HCT 40.0 36.0 - 46.0 %  Lactic acid, plasma     Status: Abnormal   Collection Time: 05/28/2022  4:20 AM  Result Value Ref Range   Lactic Acid, Venous 5.7 (HH) 0.5 - 1.9 mmol/L  Lactic acid, plasma     Status: Abnormal   Collection Time: 05/11/2022  6:23 AM  Result Value Ref Range   Lactic Acid, Venous 4.8 (HH) 0.5 - 1.9 mmol/L   Recent Results (from the past 240 hour(s))  Resp panel by RT-PCR (RSV, Flu A&B, Covid) Anterior Nasal Swab     Status: None   Collection Time: 05/12/2022  2:36 AM   Specimen: Anterior Nasal Swab  Result Value Ref Range Status    SARS Coronavirus 2 by RT PCR NEGATIVE NEGATIVE Final    Comment: (NOTE) SARS-CoV-2 target nucleic acids are NOT DETECTED.  The SARS-CoV-2 RNA is generally detectable in upper respiratory specimens during the acute phase of infection. The lowest concentration of SARS-CoV-2 viral copies this assay can detect is 138 copies/mL. A negative result does not preclude SARS-Cov-2 infection and should not be used as the sole basis for treatment or other patient management decisions. A negative result may occur with  improper specimen collection/handling, submission of specimen other than nasopharyngeal swab, presence of viral mutation(s) within the areas targeted by this assay, and inadequate number of viral copies(<138 copies/mL). A negative result must be combined with clinical observations, patient history, and epidemiological information. The expected result is Negative.  Fact Sheet for Patients:  EntrepreneurPulse.com.au  Fact Sheet for Healthcare Providers:  IncredibleEmployment.be  This test is no t yet approved or cleared by the [place] FDA and  has been authorized for detection and/or diagnosis of SARS-CoV-2 by FDA under an Emergency Use Authorization (EUA). This EUA will remain  in effect (meaning this test can be used) for the duration of the COVID-19 declaration under Section 564(b)(1) of the Act, 21 U.S.C.section 360bbb-3(b)(1), unless the authorization is terminated  or revoked sooner.       Influenza A by PCR NEGATIVE NEGATIVE Final   Influenza B by PCR NEGATIVE NEGATIVE Final    Comment: (NOTE) The Xpert Xpress SARS-CoV-2/FLU/RSV plus assay is intended as an aid in the diagnosis of influenza from Nasopharyngeal swab specimens and should not be used as a sole basis for treatment. Nasal washings and aspirates are unacceptable for Xpert Xpress SARS-CoV-2/FLU/RSV testing.  Fact Sheet for  Patients: EntrepreneurPulse.com.au  Fact Sheet for Healthcare Providers: IncredibleEmployment.be  This test is not yet approved or cleared by the [place] FDA and has been authorized for detection and/or diagnosis of SARS-CoV-2 by FDA under an Emergency Use Authorization (EUA). This EUA will remain in effect (meaning this test can be used) for the duration of the COVID-19 declaration under Section 564(b)(1) of the Act, 21 U.S.C. section 360bbb-3(b)(1), unless the authorization is terminated or revoked.     Resp Syncytial Virus by PCR NEGATIVE NEGATIVE Final    Comment: (NOTE) Fact Sheet for Patients: EntrepreneurPulse.com.au  Fact Sheet for Healthcare Providers: IncredibleEmployment.be  This test is not yet approved or cleared by the [place] FDA and has been authorized for detection and/or diagnosis of SARS-CoV-2 by FDA under an Emergency Use Authorization (EUA). This EUA will remain in effect (meaning this test can be used) for the duration of the COVID-19 declaration under Section 564(b)(1) of the Act, 21 U.S.C. section 360bbb-3(b)(1), unless the authorization is terminated or revoked.  Performed at [facility], [place] [address], [place],  [address]     Renal Function: Recent Labs    05/02/2022 0216 05/04/2022 0308  CREATININE 0.37* 0.40*   CrCl cannot be calculated (Unknown ideal weight.).  Radiologic Imaging: CT ABDOMEN PELVIS W CONTRAST  Result Date: 05/16/2022 CLINICAL DATA:  89 year old female with fever of 101.8 F. Bile colored emesis. PEG tube. EXAM: CT CHEST, ABDOMEN, AND PELVIS WITH CONTRAST TECHNIQUE: Multidetector CT imaging of the chest, abdomen and pelvis was performed following the standard protocol during bolus administration of intravenous contrast. RADIATION DOSE REDUCTION: This exam was performed according to the departmental dose-optimization  program which includes automated exposure control, adjustment of the mA and/or kV according to patient size and/or use of iterative reconstruction technique. CONTRAST:  149mL OMNIPAQUE IOHEXOL 300 MG/ML  SOLN COMPARISON:  Portable chest x-ray 0237 hours today. FINDINGS: CT CHEST FINDINGS Cardiovascular: Cardiomegaly. No pericardial effusion. Calcified aortic atherosclerosis. Mediastinum/Nodes: No mediastinal mass or lymphadenopathy. There is some central pulmonary artery enlargement bilaterally and this might account for the hilum appearance on the earlier portable chest. Lungs/Pleura: Major airways are patent. Symmetric dependent lung opacity is enhancing and most compatible with atelectasis. Some superimposed bilateral subpleural scarring. No pleural effusion or convincing acute pulmonary inflammation. Musculoskeletal: Osteopenia. Numerous chronic posterior and lateral rib fractures on the right side. Healed chronic sternal fracture suspected. No acute osseous abnormality identified. CT ABDOMEN PELVIS FINDINGS Hepatobiliary: Portal venous gas within the liver, primarily the left hepatic lobe. Dilated intra-and extrahepatic biliary tree but absent gallbladder.  The CBD measures up to 19 mm diameter, and tapers distally. Questionable sludge layering in the distal CBD on series 4, image 55, no other obstructing etiology identified. Liver enhancement remains homogeneous.  No perihepatic free fluid. Pancreas: Atrophied pancreatic head and uncinate. No mass or inflammation. Spleen: Negative. Adrenals/Urinary Tract: Mild adrenal gland thickening such as due to hyperplasia. Symmetric renal enhancement. Bilateral renal parapelvic cysts are suspected (no follow-up imaging recommended). However, there is also a large obstructing stone at the right ureteropelvic junction (series 1, image 70 and sagittal image 83, which is up to 10 mm in length by 7 mm in with. Suspected right hydronephrosis, although fairly symmetric delayed  renal enhancement. Distal to the large stone the right ureter is decompressed. The left ureter appears decompressed throughout. There is mild right pararenal inflammatory stranding. Bladder is decompressed by Foley catheter. And there could be a small additional 5 mm stone within the bladder adjacent to the Foley. Incidental pelvic phleboliths. Stomach/Bowel: Oral contrast mixed with stool in redundant rectosigmoid colon. Sigmoid and descending colon diverticulosis, moderate to severe but no active inflammation. Intermittent diverticula in the transverse colon and at the right hepatic flexure without active inflammation. Abnormal mesenteric and portal venous branch vessel gas in the right lower quadrant (series 4, image 43 and series 1, image 89. This is surrounding the cecum and proximal ascending colon. The cecum is mildly dilated with gas up to 7 cm. The terminal ileum remains decompressed. Mild if any large bowel wall thickening or inflammation there. Appendix is not identified. Upstream small bowel is nondilated. Percutaneous gastrostomy tube in place with decompressed stomach. Decompressed duodenum. No pneumoperitoneum.  No free fluid identified. Vascular/Lymphatic: Portal venous gas in the superior mesenteric vein series 1, image 62. Portal venous system appears to remain patent. Aortoiliac calcified atherosclerosis. Negative for abdominal aortic aneurysm. Major arterial structures are patent. No lymphadenopathy identified. Reproductive: Surgically absent uterus. Diminutive or absent ovaries. Other: No free fluid. Musculoskeletal: Osteopenia with age indeterminate mild L4 and L5 compression fractures. Superimposed lumbar disc, endplate, facet degeneration. Medial left pubic symphysis deformity, possibly with superimposed bone cement. No acute or suspicious osseous changes identified there. IMPRESSION: 1. Positive for Portal Venous Gas in the liver, the superior mesenteric vein, and in the mesentery  surrounding the Cecum And Proximal Ascending Colon. This is suspicious for proximal large bowel ischemia, however, there are little if any changes of colitis there and no convincing pneumatosis. No evidence of bowel obstruction or perforation (no pneumoperitoneum or free fluid). Percutaneous gastrostomy tube with no adverse features. 2. Superimposed right side obstructive uropathy with a large right UPJ 10 x 7 mm stone, right hydronephrosis and pararenal inflammation. Possible small 5 mm stone in the bladder which is decompressed by Foley catheter. 3. Dilated intra- and extrahepatic biliary tree with questionable sludge in the distal CBD. But absent gallbladder and no other obstructing etiology identified. 4. Cardiomegaly. Mild lower lobe atelectasis. Aortic Atherosclerosis (ICD10-I70.0). 5. Osteopenia with age indeterminate mild L4 and L5 compression fractures. Salient findings discussed by telephone with Dr. Thayer Jew on 05/14/2022 at 05:29 . Electronically Signed   By: Genevie Ann M.D.   On: 05/22/2022 05:35   CT Chest W Contrast  Result Date: 05/13/2022 CLINICAL DATA:  89 year old female with fever of 101.8 F. Bile colored emesis. PEG tube. EXAM: CT CHEST, ABDOMEN, AND PELVIS WITH CONTRAST TECHNIQUE: Multidetector CT imaging of the chest, abdomen and pelvis was performed following the standard protocol during bolus administration of intravenous contrast. RADIATION DOSE REDUCTION:  This exam was performed according to the departmental dose-optimization program which includes automated exposure control, adjustment of the mA and/or kV according to patient size and/or use of iterative reconstruction technique. CONTRAST:  142mL OMNIPAQUE IOHEXOL 300 MG/ML  SOLN COMPARISON:  Portable chest x-ray 0237 hours today. FINDINGS: CT CHEST FINDINGS Cardiovascular: Cardiomegaly. No pericardial effusion. Calcified aortic atherosclerosis. Mediastinum/Nodes: No mediastinal mass or lymphadenopathy. There is some central  pulmonary artery enlargement bilaterally and this might account for the hilum appearance on the earlier portable chest. Lungs/Pleura: Major airways are patent. Symmetric dependent lung opacity is enhancing and most compatible with atelectasis. Some superimposed bilateral subpleural scarring. No pleural effusion or convincing acute pulmonary inflammation. Musculoskeletal: Osteopenia. Numerous chronic posterior and lateral rib fractures on the right side. Healed chronic sternal fracture suspected. No acute osseous abnormality identified. CT ABDOMEN PELVIS FINDINGS Hepatobiliary: Portal venous gas within the liver, primarily the left hepatic lobe. Dilated intra-and extrahepatic biliary tree but absent gallbladder. The CBD measures up to 19 mm diameter, and tapers distally. Questionable sludge layering in the distal CBD on series 4, image 55, no other obstructing etiology identified. Liver enhancement remains homogeneous.  No perihepatic free fluid. Pancreas: Atrophied pancreatic head and uncinate. No mass or inflammation. Spleen: Negative. Adrenals/Urinary Tract: Mild adrenal gland thickening such as due to hyperplasia. Symmetric renal enhancement. Bilateral renal parapelvic cysts are suspected (no follow-up imaging recommended). However, there is also a large obstructing stone at the right ureteropelvic junction (series 1, image 70 and sagittal image 83, which is up to 10 mm in length by 7 mm in with. Suspected right hydronephrosis, although fairly symmetric delayed renal enhancement. Distal to the large stone the right ureter is decompressed. The left ureter appears decompressed throughout. There is mild right pararenal inflammatory stranding. Bladder is decompressed by Foley catheter. And there could be a small additional 5 mm stone within the bladder adjacent to the Foley. Incidental pelvic phleboliths. Stomach/Bowel: Oral contrast mixed with stool in redundant rectosigmoid colon. Sigmoid and descending colon  diverticulosis, moderate to severe but no active inflammation. Intermittent diverticula in the transverse colon and at the right hepatic flexure without active inflammation. Abnormal mesenteric and portal venous branch vessel gas in the right lower quadrant (series 4, image 43 and series 1, image 89. This is surrounding the cecum and proximal ascending colon. The cecum is mildly dilated with gas up to 7 cm. The terminal ileum remains decompressed. Mild if any large bowel wall thickening or inflammation there. Appendix is not identified. Upstream small bowel is nondilated. Percutaneous gastrostomy tube in place with decompressed stomach. Decompressed duodenum. No pneumoperitoneum.  No free fluid identified. Vascular/Lymphatic: Portal venous gas in the superior mesenteric vein series 1, image 62. Portal venous system appears to remain patent. Aortoiliac calcified atherosclerosis. Negative for abdominal aortic aneurysm. Major arterial structures are patent. No lymphadenopathy identified. Reproductive: Surgically absent uterus. Diminutive or absent ovaries. Other: No free fluid. Musculoskeletal: Osteopenia with age indeterminate mild L4 and L5 compression fractures. Superimposed lumbar disc, endplate, facet degeneration. Medial left pubic symphysis deformity, possibly with superimposed bone cement. No acute or suspicious osseous changes identified there. IMPRESSION: 1. Positive for Portal Venous Gas in the liver, the superior mesenteric vein, and in the mesentery surrounding the Cecum And Proximal Ascending Colon. This is suspicious for proximal large bowel ischemia, however, there are little if any changes of colitis there and no convincing pneumatosis. No evidence of bowel obstruction or perforation (no pneumoperitoneum or free fluid). Percutaneous gastrostomy tube with no adverse features. 2.  Superimposed right side obstructive uropathy with a large right UPJ 10 x 7 mm stone, right hydronephrosis and pararenal  inflammation. Possible small 5 mm stone in the bladder which is decompressed by Foley catheter. 3. Dilated intra- and extrahepatic biliary tree with questionable sludge in the distal CBD. But absent gallbladder and no other obstructing etiology identified. 4. Cardiomegaly. Mild lower lobe atelectasis. Aortic Atherosclerosis (ICD10-I70.0). 5. Osteopenia with age indeterminate mild L4 and L5 compression fractures. Salient findings discussed by telephone with Dr. Thayer Jew on 05/13/2022 at 05:29 . Electronically Signed   By: Genevie Ann M.D.   On: 05/14/2022 05:35   DG Chest Port 1 View  Result Date: 05/16/2022 CLINICAL DATA:  Shortness of breath. EXAM: PORTABLE CHEST 1 VIEW COMPARISON:  Chest radiograph dated 06/14/2021. FINDINGS: Right perihilar density, progressed since the prior radiograph. Although this may represent dilated vessels, underlying mass or adenopathy is not excluded. Further evaluation with chest CT is recommended. There is minimal bibasilar atelectasis. No pleural effusion or pneumothorax. Stable cardiomegaly. No acute osseous pathology. IMPRESSION: Slight progression of right perihilar density. Further evaluation with chest CT is recommended. Electronically Signed   By: Anner Crete M.D.   On: 05/06/2022 03:06    I independently reviewed the above imaging studies.  Impression/Recommendation Sepsis UTI Obstructing right UPJ calculus  I reviewed the patient's chart including provider notes, lab results, and imaging results. I personally reviewed the CT study from earlier today with results as noted above.. Given the patient's clinical status, I recommended consideration for a right nephrostomy tube by interventional radiology. A formal consultation was not performed as the patient's family did not wish to proceed with any intervention and were requesting comfort care only. Please call with any questions or if the family is interested in intervention.  Michaelle Birks 05/22/2022, 3:39 PM

## 2022-05-23 NOTE — IPAL (Signed)
  Interdisciplinary Goals of Care Family Meeting   Date carried out: 06-22-2022  Location of the meeting: Bedside  Member's involved: Physician, Bedside Registered Nurse, and Family Member or next of kin  Durable Power of Attorney or acting medical decision maker: Daughter    Discussion: We discussed goals of care for [employer] .    All in agreement that she would want to pass naturally without further aggressive interventions.  Discussed fentanyl infusion (allergic to morphine), PRN ativan, and PRN haldol  Turn off pressors Allow natural passing  Code status:   Code Status: DNR   Disposition: In-patient comfort care  Time spent for the meeting: 12 mins    Candee Furbish, MD  22-Jun-2022, 11:34 AM

## 2022-05-23 NOTE — Progress Notes (Signed)
A consult was received from an ED physician for vancomycin and cefepime per pharmacy dosing.  The patient's profile has been reviewed for ht/wt/allergies/indication/available labs. A one time order has been placed for vancomycin 1gm and cefepime 2gm   Further antibiotics/pharmacy consults should be ordered by admitting physician if indicated.                       Thank you, Dolly Rias RPh 04/30/2022, 3:00 AM

## 2022-05-23 NOTE — ED Triage Notes (Signed)
Has pneumonia recently. Increased temp 101.8 at facility. Pt has hx of UTI, Afib no thinners. 500LR bolus administered enroute. Facility gave 2 of 325mg  of Tylenol. Facility reports abdominal distention and emesis through PEG tube. Reports bile color emesis in PEG Tube.

## 2022-05-24 DIAGNOSIS — Z7189 Other specified counseling: Secondary | ICD-10-CM | POA: Diagnosis not present

## 2022-05-24 DIAGNOSIS — Z515 Encounter for palliative care: Secondary | ICD-10-CM

## 2022-05-24 DIAGNOSIS — R6521 Severe sepsis with septic shock: Secondary | ICD-10-CM | POA: Diagnosis not present

## 2022-05-24 DIAGNOSIS — A419 Sepsis, unspecified organism: Secondary | ICD-10-CM | POA: Diagnosis not present

## 2022-05-24 NOTE — Consult Note (Signed)
Consultation Note Date: 05/24/2022   Patient Name: Sarah Boone  DOB: 1934-03-17  MRN: [id number]  Age / Sex: 89 y.o., female  PCP: Ellan Lambert, NP Referring Physician: Lorin Glass, MD  Reason for Consultation: Terminal Care  HPI/Patient Profile: 89 y.o. female    admitted on 06-04-2022   Clinical Assessment and Goals of Care: 89 year old lady from long-term care facility [facility] in [place], [place] admitted to the hospital with overwhelming sepsis, infected kidney stone and also possible ischemic colitis.  Patient presented to the emergency room with a fever and history of recent pneumonias and recurrent urinary tract infections.  CT demonstrated 10 x 7 mm stone at right UPJ with associated hydronephrosis.  Has a past medical history significant for dementia and atrial fibrillation. Patient was transition to comfort measures on 05-24-2022.  PMT consult for terminal care and for offering additional support has been requested. Chart reviewed, patient seen and examined, 2 daughters present at bedside.  Brief life review performed.  End-of-life signs and symptoms discussed.  Patient's daughters understand current treatment plan and are thankful for the care patient has been provided with in this hospitalization thus far.  They understand that the patient's condition is incurable and irreversible and they desire continuation of comfort measures.  Other discussions as noted below.  NEXT OF KIN  2 daughters present at bedside.   Discussion/SUMMARY OF RECOMMENDATIONS   Discussed with patient's daughters about patient's current condition.  Comfort care medications explained.  Patient's 2 daughters present at bedside had questions regarding hospice.  Patient continues to be declining rapidly, anticipate hospital death.  Stated frankly but compassionately that the patient does not appear to be  safe to be transported to a residential hospice setting.  All of their questions and concerns addressed to the best of my ability. Medication list noted, agree with current comfort measures.   Patient's family wished for further education regarding end-of-life signs and symptoms and what to expect as they are holding vigil at bedside.-See below Less Than 2 Days Before Death In the final hours, patients exhibit specific clinical signs that indicate the approach of death.  Clinical Signs:  1.Death rattle  Management Techniques: Educate the family about the nature of the sound Reposition the patient to promote drainage Consider using anticholinergic medications if the patient is suffering.  2.Apnea. Management Techniques:  Educate the family about the possibility of irregular breathing patterns Administer opioids if dyspnea is present.  3.Respiration with mandibular movement  Management Techniques: Educate the family about changes in breathing patterns.  4.Decreased urine output  Management Techniques: Educate the family about the natural decrease in bodily functions.  5.Pulselessness of radial artery  Management Techniques: Educate the family about changes in circulation and perfusion.  6.Inability to close eyelids  Management Techniques: Educate the family about the physical changes in muscle control Use a wet washcloth if eyes are dry or irritated.  7.Grunting of vocal cords  Management Techniques: Educate the family the family about the possibility of vocal cord tension  Administer opioids if pain is present.  8.Fever  Management Techniques: Place a cool washcloth on the patient's forehead Remove excess blankets Use a fan Administer acetominophin if necessary  Code Status/Advance Care Planning: DNR   Symptom Management:     Palliative Prophylaxis:  Delirium Protocol  Additional Recommendations (Limitations, Scope, Preferences): Full Comfort  Care  Psycho-social/Spiritual:  Desire for further Chaplaincy support:yes Additional Recommendations: Education on Hospice  Prognosis:  Hours - Days  Discharge Planning: Anticipated Hospital Death      Primary Diagnoses: Present on Admission:  Septic shock ([place])   I have reviewed the medical record, interviewed the patient and family, and examined the patient. The following aspects are pertinent.  Past Medical History:  Diagnosis Date   Anxiety    Arthritis    Atrial fibrillation (HCC)    Chronic respiratory failure (HCC)    Constipation    Dementia (HCC)    Esophageal stenosis status post PEG placement    Gastritis    Hiatal hernia    Hyperlipidemia    Hypertension    Hyponatremia    Hypothyroidism    Insomnia    Oxygen dependent    Type 2 diabetes mellitus ([place])    Social History   Socioeconomic History   Marital status: Divorced    Spouse name: Not on file   Number of children: 2   Years of education: Not on file   Highest education level: Not on file  Occupational History   Occupation: retired  Tobacco Use   Smoking status: Never   Smokeless tobacco: Never  Vaping Use   Vaping Use: Never used  Substance and Sexual Activity   Alcohol use: Not Currently   Drug use: Never   Sexual activity: Not Currently  Other Topics Concern   Not on file  Social History Narrative   Not on file   Social Determinants of Health   Financial Resource Strain: Not on file  Food Insecurity: Not on file  Transportation Needs: Not on file  Physical Activity: Not on file  Stress: Not on file  Social Connections: Not on file   Family History  Problem Relation Age of Onset   Angina Mother    Heart disease Father    Diabetes Father    Diabetes Sister    Diabetes Brother    Fibromyalgia Daughter    Diabetes Daughter    Arthritis Daughter    Irritable bowel syndrome Daughter    Hypertension Daughter    Diabetes Daughter    Scheduled Meds: Continuous  Infusions:  sodium chloride 250 mL (06-20-22 0915)   sodium chloride 20 mL/hr at 06-20-22 1219   fentaNYL infusion INTRAVENOUS 150 mcg/hr (05/24/22 1036)   PRN Meds:.acetaminophen **OR** acetaminophen, diphenhydrAMINE, fentaNYL, glycopyrrolate **OR** glycopyrrolate **OR** glycopyrrolate, haloperidol lactate, midazolam, ondansetron **OR** ondansetron (ZOFRAN) IV, polyvinyl alcohol Medications Prior to Admission:  Prior to Admission medications   Medication Sig Start Date End Date Taking? Authorizing Provider  acetaminophen (TYLENOL) 325 MG tablet Place 650 mg into feeding tube every 6 (six) hours as needed for fever or moderate pain. 11/03/20  Yes [provider]  ascorbic acid (VITAMIN C) 500 MG tablet Place 500 mg into feeding tube daily.   Yes [provider]  CALCIUM + VITAMIN D3 600-10 MG-MCG TABS Give 1 tablet by tube daily. 11/03/20  Yes [provider]  cholestyramine Lucrezia Starch) 4 GM/DOSE powder Place 4 g into feeding tube daily as needed (loose stool). 12/19/20  Yes [provider]  clotrimazole-betamethasone (  LOTRISONE) cream Apply topically 2 (two) times daily. 09/06/21 09/06/22 Yes [provider]  diclofenac Sodium (VOLTAREN) 1 % GEL Apply 2 g topically 2 (two) times daily as needed (pain right hip/knee). 11/08/20  Yes [provider]  diltiazem (CARDIZEM) 120 MG tablet Take 120 mg by mouth 3 (three) times daily. 12/25/20  Yes [provider]  estradiol (ESTRACE) 0.1 MG/GM vaginal cream Place 1 Applicatorful vaginally at bedtime. 12/21/20  Yes [provider]  famotidine (PEPCID) 20 MG tablet Place 20 mg into feeding tube 2 (two) times daily.   Yes [provider]  furosemide (LASIX) 20 MG tablet 10 mg by Per J Tube route daily.   Yes [provider]  guaiFENesin (ROBITUSSIN) 100 MG/5ML liquid Take 20 mLs by mouth every 4 (four) hours as needed for cough or to loosen phlegm.   Yes [provider]   ipratropium-albuterol (DUONEB) 0.5-2.5 (3) MG/3ML SOLN Take 3 mLs by nebulization every 6 (six) hours as needed (cough, chest tightness). 04/26/21  Yes Hunsucker, Bonna Gains, MD  lactulose (CHRONULAC) 10 GM/15ML solution Place 15-30 g into feeding tube as directed. Take 15 ml daily and Take 45 mls daily PRN for constipation   Yes [provider]  levothyroxine (SYNTHROID) 200 MCG tablet Place 200 mcg into feeding tube daily. 12/28/20  Yes [provider]  Melatonin 5 MG CAPS Take 5 mg by mouth at bedtime. 12/30/20  Yes [provider]  metFORMIN (GLUCOPHAGE) 500 MG tablet Place 500 mg into feeding tube 2 (two) times daily with a meal.   Yes [provider]  methenamine (HIPREX) 1 g tablet Place 1 g into feeding tube 2 (two) times daily with a meal. UTI prophylaxis   Yes [provider]  mineral oil-hydrophilic petrolatum (AQUAPHOR) ointment Apply 1 Application topically in the morning and at bedtime. 02/10/21  Yes [provider]  nitroGLYCERIN (NITROSTAT) 0.4 MG SL tablet Place 0.4 mg under the tongue every 5 (five) minutes as needed for chest pain. 11/03/20  Yes [provider]  NOVOLOG FLEXPEN 100 UNIT/ML FlexPen Inject 2-8 Units into the skin 3 (three) times daily with meals. 11/14/20  Yes [provider]  [facility] powder Apply 1 Application topically 3 (three) times daily. Areas under breasts and skin folds 07/19/20  Yes [provider]  ondansetron (ZOFRAN-ODT) 4 MG disintegrating tablet Take 4 mg by mouth every 6 (six) hours as needed for nausea. 01/29/22  Yes [provider]  polyethylene glycol powder (GLYCOLAX/MIRALAX) 17 GM/SCOOP powder Take 17 g by mouth daily as needed for severe constipation. 11/03/20  Yes [provider]  Probiotic Product (BACID) CAPS Take 1 capsule by mouth daily. 12/07/20  Yes [provider]  SANTYL 250 UNIT/GM ointment Apply 1 Application topically daily. Apply to buttocks  wound 04/03/22  Yes [provider]  sodium hypochlorite (DAKIN'S 1/2 STRENGTH) external solution Irrigate with 1 Application as directed 2 (two) times a week. On bath days and prn   Yes [provider]  traMADol (ULTRAM) 50 MG tablet Take 50 mg by mouth every 6 (six) hours as needed for moderate pain. 01/02/21  Yes [provider]  [place] (DERMACLOUD) OINT Apply 1 application  topically every 8 (eight) hours as needed (rash). Apply to buttocks wound every shift and prn soiling    [provider]   Allergies  Allergen Reactions   Ciprofloxacin     Generic    Crestor [Rosuvastatin]    Hydrocodone  Latex    Sulfadiazine    Review of Systems No verbal  Physical Exam Essentially unresponsive Appears comfortable Moans at times Is having some apneic spells Does not verbalize, does not arouse   Vital Signs: BP 98/62 (BP Location: Right Arm)   Pulse 96   Temp 97.7 F (36.5 C) (Oral)   Resp 12   SpO2 100%  Pain Scale: PAINAD POSS *See Group Information*: S-Acceptable,Sleep, easy to arouse     SpO2: SpO2: 100 % O2 Device:SpO2: 100 % O2 Flow Rate: .O2 Flow Rate (L/min): 5 L/min  IO: Intake/output summary:  Intake/Output Summary (Last 24 hours) at 05/24/2022 1335 Last data filed at 05/15/2022 2000 Gross per 24 hour  Intake 261.22 ml  Output --  Net 261.22 ml    LBM:   Baseline Weight:   Most recent weight:       Palliative Assessment/Data:   PPS 20%  Time In:  12.30 Time Out:  1330 Time Total:  60  Greater than 50%  of this time was spent counseling and coordinating care related to the above assessment and plan.  Signed by: Loistine Chance, MD   Please contact Palliative Medicine Team phone at [phone] for questions and concerns.  For individual provider: See Shea Evans

## 2022-05-24 NOTE — Progress Notes (Signed)
  Transition of Care [facility]) Screening Note   Patient Details  Name: Sarah Boone Date of Birth: 01/23/1934   Transition of Care [facility]) CM/SW Contact:    Vassie Moselle, LCSW Phone Number: 05/24/2022, 4:05 PM  Pt on full comfort measures. Anticipate hospital death. No TOC needs at this time.   Transition of Care Department [facility]) has reviewed patient and no TOC needs have been identified at this time. We will continue to monitor patient advancement through interdisciplinary progression rounds. If new patient transition needs arise, please place a TOC consult.

## 2022-05-24 NOTE — Progress Notes (Signed)
05/24/2022   I have seen and evaluated the patient for end of life care in setting of overwhelming sepsis.  S:  No events. Resting comfortably. One episode of calling out overnight.  O: Blood pressure 98/62, pulse 96, temperature 97.7 F (36.5 C), temperature source Oral, resp. rate 16, SpO2 100 %.  Cheyne-Stokes breathing MM dry Comatose  A:  Comfort care in setting of admission for overwhelming sepsis, infected kidney stone, possible ischemic coliits  P:  - Fent titrated to RR< 12 - Versed for breakthrough agitation - In hospital death expected - Daughter updated at bedside  Erskine Emery MD [facility] epic messenger for cross cover needs If after hours, please call E-link

## 2022-05-24 NOTE — Progress Notes (Signed)
Patients daughter refused versed stating "she seems to be resting comfortably right now.  Let's just wait."

## 2022-05-25 DIAGNOSIS — K559 Vascular disorder of intestine, unspecified: Secondary | ICD-10-CM | POA: Diagnosis not present

## 2022-05-25 DIAGNOSIS — N390 Urinary tract infection, site not specified: Secondary | ICD-10-CM | POA: Diagnosis not present

## 2022-05-25 DIAGNOSIS — T83511A Infection and inflammatory reaction due to indwelling urethral catheter, initial encounter: Secondary | ICD-10-CM

## 2022-05-25 DIAGNOSIS — A419 Sepsis, unspecified organism: Secondary | ICD-10-CM | POA: Diagnosis not present

## 2022-05-25 DIAGNOSIS — N200 Calculus of kidney: Secondary | ICD-10-CM | POA: Diagnosis not present

## 2022-05-25 DIAGNOSIS — R531 Weakness: Secondary | ICD-10-CM | POA: Diagnosis not present

## 2022-05-25 DIAGNOSIS — Z515 Encounter for palliative care: Secondary | ICD-10-CM | POA: Diagnosis not present

## 2022-05-25 DIAGNOSIS — Z7189 Other specified counseling: Secondary | ICD-10-CM | POA: Diagnosis not present

## 2022-05-25 LAB — URINE CULTURE: Culture: >=100000 — AB

## 2022-05-25 NOTE — Progress Notes (Signed)
Daily Progress Note   Patient Name: Sarah Boone       Date: 05/25/2022 DOB: January 11, 1934  Age: 89 y.o. MRN#: [id number] Attending Physician: Chesley Mires, MD Primary Care Physician: Verl Blalock, NP Admit Date: 05/30/22  Reason for Consultation/Follow-up: Terminal Care  Subjective: Actively dying, resting in bed, apneic spells, pale   Length of Stay: 2  Current Medications: Scheduled Meds:    Continuous Infusions:  sodium chloride 250 mL (05/30/22 0915)   sodium chloride 20 mL/hr at 05/30/22 1219   fentaNYL infusion INTRAVENOUS 150 mcg/hr (05/25/22 1117)    PRN Meds: acetaminophen **OR** acetaminophen, diphenhydrAMINE, fentaNYL, glycopyrrolate **OR** glycopyrrolate **OR** glycopyrrolate, haloperidol lactate, midazolam, ondansetron **OR** ondansetron (ZOFRAN) IV, polyvinyl alcohol  Physical Exam          unresponsive Appears comfortable  apneic spells Does not verbalize, does not arouse Vital Signs: BP (!) 97/51 (BP Location: Right Arm)   Pulse (!) 130   Temp 97.7 F (36.5 C) (Oral)   Resp (!) 6   SpO2 100%  SpO2: SpO2: 100 % O2 Device: O2 Device: Nasal Cannula O2 Flow Rate: O2 Flow Rate (L/min): 5 L/min  Intake/output summary:  Intake/Output Summary (Last 24 hours) at 05/25/2022 1137 Last data filed at 05/25/2022 0300 Gross per 24 hour  Intake 1764.77 ml  Output 450 ml  Net 1314.77 ml   LBM:   Baseline Weight:   Most recent weight:         Palliative Assessment/Data:      Patient Active Problem List   Diagnosis Date Noted   Septic shock ([place]) May 30, 2022   Kidney stone May 30, 2022   Palliative care encounter 04/10/2022   Ambulatory dysfunction 10/27/2021   Bedbound 10/27/2021   Chronic indwelling Foley catheter 10/27/2021   Incontinence of feces  10/27/2021   Urinary incontinence 10/27/2021   Pressure injury of skin of sacral region 10/27/2021   Status post insertion of percutaneous endoscopic gastrostomy (PEG) tube ([place]) 10/27/2021   Vaginal vault prolapse 10/27/2021   Dementia ([place]) 09/05/2021   Esophageal dysphagia 09/05/2021   Hyperglycemia due to diabetes mellitus ([place]) 09/04/2021   PEG tube malfunction ([place]) 09/04/2021   UTI (urinary tract infection) 09/04/2021   Permanent atrial fibrillation ([place]) 07/04/2021   SOB (shortness of breath) 07/04/2021   Primary hypertension 07/04/2021   Severe vascular dementia with  anxiety ([place]) 07/04/2021   DNR (do not resuscitate) 07/04/2021   Type 2 diabetes mellitus ([place]) 01/12/2021   RBBB 01/21/2020    Palliative Care Assessment & Plan   Patient Profile:  89 year old lady from long-term care facility [facility] in [place], [place] admitted to the hospital with overwhelming sepsis, infected kidney stone and also possible ischemic colitis.  Patient presented to the emergency room with a fever and history of recent pneumonias and recurrent urinary tract infections.  CT demonstrated 10 x 7 mm stone at right UPJ with associated hydronephrosis.  Has a past medical history significant for dementia and atrial fibrillation. Patient was transition to comfort measures on 05-24-2022.  PMT consult for terminal care and for offering additional support has been requested.  Assessment: End of life.   Recommendations/Plan: Continue current comfort measures Comfortable on current Fentanyl drip.  Anticipate hospital death.   Goals of Care and Additional Recommendations: Limitations on Scope of Treatment: Full Comfort Care  Code Status:    Code Status Orders  (From admission, onward)           Start     Ordered   05/19/2022 1122  Do not attempt resuscitation (DNR)  Continuous       Question Answer Comment  If patient has no pulse and is not breathing Do Not Attempt  Resuscitation   If patient has a pulse and/or is breathing: Medical Treatment Goals COMFORT MEASURES: Keep clean/warm/dry, use medication by any route; positioning, wound care and other measures to relieve pain/suffering; use oxygen, suction/manual treatment of airway obstruction for comfort; do not transfer unless for comfort needs.   Consent: Discussion documented in EHR or advanced directives reviewed      05/25/2022 1122           Code Status History     Date Active Date Inactive Code Status Order ID Comments User Context   05/01/2022 0759 05/13/2022 1122 DNR [id number]  Candee Furbish, MD ED       Prognosis:  Hours - Days  Discharge Planning: Anticipated Hospital Death  Care plan was discussed with IDT  Thank you for allowing the Palliative Medicine Team to assist in the care of this patient.  Low MDM     Greater than 50%  of this time was spent counseling and coordinating care related to the above assessment and plan.  Loistine Chance, MD  Please contact Palliative Medicine Team phone at [phone] for questions and concerns.

## 2022-05-25 NOTE — Progress Notes (Addendum)
   NAMEAnwyn Boone, MRN:  [id number], DOB:  1933-06-13, LOS: 2 ADMISSION DATE:  Jun 17, 2022, CONSULTATION DATE:  17-Jun-2022 REFERRING MD:  Horton, CHIEF COMPLAINT:  fever   History of Present Illness:  89 year old woman w/ hx of dementia p/w fevers, abdominal distension from SNF.  BP low despite fluids in ER so PCCM to admit.  Lab workup in ER reveals lactic acidosis, hypoxemia, hypocalcemia, leukocytosis.  CT of abd showing atelectasis, obstructing R renal stone, portal gas without other signs of intestinal ischemia.  EDP spoke with daughter: okay for peripheral pressors but no escalation.  I called daughter Sarah Boone and confirmed no nephrostomy tube, no surgery, keep things as is until they come up to say goodbye.   Pertinent  Medical History  Afib not on [facility]  Significant Hospital Events: Including procedures, antibiotic start and stop dates in addition to other pertinent events   06/17/2022 Admit  1/26 On fentanyl gtt, comfortable   Interim History / Subjective:  Remains on fentanyl infusion for comfort   Objective   Blood pressure 108/74, pulse 100, temperature 98.4 F (36.9 C), temperature source Axillary, resp. rate 10, SpO2 100 %.        Intake/Output Summary (Last 24 hours) at 05/25/2022 1227 Last data filed at 05/25/2022 0300 Gross per 24 hour  Intake 1764.77 ml  Output 450 ml  Net 1314.77 ml   There were no vitals filed for this visit.  Examination: General: elderly female lying in bed in NAD, daughter at bedside   HEENT: MM pink/dry, eyes closed, open mouth Neuro: obtunded, moans intermittently  CV: s1s2 RRR, no m/r/g PULM: shallow, non-labored, slow, clear bilaterally  GI: soft, bsx4 active  Extremities: warm/dry, trace to 1+ dependent edema  Skin: no rashes or lesions  Resolved Hospital Problem list   N/A  Assessment & Plan:   Septic/distributive shock secondary to Infected Renal Calculi, possible Ischemic Colitis: Persistent elevation of lactate.  Portal  gas could portend intestinal ischemia which she is at risk for given her non-anticoagulated Afib.  This could also just be infected stone.  Regardless given her baseline advanced vascular dementia and daughters' desire for her not to suffer, transitioned to palliative care in hospital.  Afib/RVR, Hx HTN, esopahgeal dysmotility post PEG, HLD, hypothyroidism, DM2  -continue fentanyl infusion  -PRN versed, robinul -continue supportive care for patient comfort  -no further labs, unnecessary finger sticks  -DNR/DNI -anticipate hospital death  -comfort measures  -spoke with daughter, discontinue oxygen   Best Practice (right click and "Reselect all SmartList Selections" daily)  Not indicated, comfort measures     Noe Gens, MSN, APRN, NP-C, AGACNP-BC [facility] 05/25/2022, 12:27 PM   Please see [email or web address] for pager details.   From 7A-7P if no response, please call [phone] After hours, please call ELink [phone]

## 2022-05-26 DIAGNOSIS — A419 Sepsis, unspecified organism: Secondary | ICD-10-CM | POA: Diagnosis not present

## 2022-05-26 DIAGNOSIS — Z515 Encounter for palliative care: Secondary | ICD-10-CM | POA: Diagnosis not present

## 2022-05-26 DIAGNOSIS — R6521 Severe sepsis with septic shock: Secondary | ICD-10-CM | POA: Diagnosis not present

## 2022-05-26 LAB — CULTURE, BLOOD (ROUTINE X 2)

## 2022-05-29 LAB — CULTURE, BLOOD (ROUTINE X 2): Special Requests: ADEQUATE

## 2022-05-31 NOTE — Progress Notes (Signed)
Patient expired. Time of death 9.

## 2022-05-31 NOTE — Progress Notes (Signed)
Patient's family gave next of kin contact information and funeral home information in case of hospital demise- All information input in the post mortem flowsheet.

## 2022-05-31 NOTE — Death Summary Note (Signed)
  Sarah Boone was a 89 y.o. female with history of dementia admitted from SNF on June 10, 2022  with fever, abdominal distention, and hypotension.  Found to have lactic acidosis, leukocytosis, hypocalcemia, and hypoxia.  CT abdomen showed obstructing Rt renal stone.  Family agreed to trial of antibiotics and pressors, but not escalation of care beyond this.  Palliative care was consulted, and after further discussion with family decision made to transition to comfort care on 1/26.  She expired on Jun 13, 2022 at 18:05.  Cause of death: Septic shock (POA) secondary to infected right renal calculus and ischemic colitis  Final diagnoses: Dementia History of atrial fibrillation History of hypertension Esophageal dysmotility s/p PEG DM type 2 poorly controlled with hyperglycemia Hypothyroidism Lactic acidosis Leukocytosis Hypocalcemia  Chesley Mires, MD [facility] 06-13-22, 6:23 PM

## 2022-05-31 NOTE — Progress Notes (Signed)
Daily Progress Note   Patient Name: Sarah Boone       Date: 05-28-2022 DOB: 1933/12/03  Age: 89 y.o. MRN#: [id number] Attending Physician: Chesley Mires, MD Primary Care Physician: Verl Blalock, NP Admit Date: 05/13/2022  Reason for Consultation/Follow-up: Terminal Care  Subjective: Actively dying, resting in bed, apneic spells,  2 daughters at bedside.   Length of Stay: 3  Current Medications: Scheduled Meds:    Continuous Infusions:  sodium chloride 250 mL (05/03/2022 0915)   sodium chloride 10 mL/hr at 05/25/22 1257   fentaNYL infusion INTRAVENOUS 150 mcg/hr (05/25/22 1117)    PRN Meds: acetaminophen **OR** acetaminophen, diphenhydrAMINE, fentaNYL, glycopyrrolate **OR** glycopyrrolate **OR** glycopyrrolate, haloperidol lactate, midazolam, ondansetron **OR** ondansetron (ZOFRAN) IV, polyvinyl alcohol  Physical Exam          unresponsive Appears comfortable  apneic spells Does not verbalize, does not arouse Extremities warm to touch  Vital Signs: BP 94/67 (BP Location: Right Arm)   Pulse (!) 102   Temp 98.4 F (36.9 C) (Axillary)   Resp 20   SpO2 99%  SpO2: SpO2: 99 % O2 Device: O2 Device: Nasal Cannula O2 Flow Rate: O2 Flow Rate (L/min): 2 L/min  Intake/output summary:  Intake/Output Summary (Last 24 hours) at 2022/05/28 1113 Last data filed at 05/25/2022 2045 Gross per 24 hour  Intake --  Output 250 ml  Net -250 ml    LBM:   Baseline Weight:   Most recent weight:         Palliative Assessment/Data:      Patient Active Problem List   Diagnosis Date Noted   Ischemic bowel disease ([place]) 05/25/2022   Septic shock ([place]) 05/05/2022   Kidney stone 05/13/2022   Palliative care encounter 04/10/2022   Ambulatory dysfunction 10/27/2021   Bedbound  10/27/2021   Chronic indwelling Foley catheter 10/27/2021   Incontinence of feces 10/27/2021   Urinary incontinence 10/27/2021   Pressure injury of skin of sacral region 10/27/2021   Status post insertion of percutaneous endoscopic gastrostomy (PEG) tube ([place]) 10/27/2021   Vaginal vault prolapse 10/27/2021   Dementia ([place]) 09/05/2021   Esophageal dysphagia 09/05/2021   Hyperglycemia due to diabetes mellitus ([place]) 09/04/2021   PEG tube malfunction ([place]) 09/04/2021   UTI (urinary tract infection) 09/04/2021   Permanent atrial fibrillation ([facility]) 07/04/2021   SOB (shortness  of breath) 07/04/2021   Primary hypertension 07/04/2021   Severe vascular dementia with anxiety ([place]) 07/04/2021   DNR (do not resuscitate) 07/04/2021   Type 2 diabetes mellitus ([place]) 01/12/2021   RBBB 01/21/2020    Palliative Care Assessment & Plan   Patient Profile:  89 year old lady from long-term care facility [facility] in [place], [place] admitted to the hospital with overwhelming sepsis, infected kidney stone and also possible ischemic colitis.  Patient presented to the emergency room with a fever and history of recent pneumonias and recurrent urinary tract infections.  CT demonstrated 10 x 7 mm stone at right UPJ with associated hydronephrosis.  Has a past medical history significant for dementia and atrial fibrillation. Patient was transition to comfort measures on 05-24-2022.  PMT consult for terminal care and for offering additional support has been requested.  Assessment: End of life.   Recommendations/Plan: Continue current comfort measures Comfortable on current Fentanyl drip.  Anticipate hospital death.  End of life signs and symptoms discussed again with 2 daughters holding vigil at bedside.  Offered active listening, supportive empathic presence and other therapeutic communication techniques. Goals of Care and Additional Recommendations: Limitations on Scope of Treatment: Full  Comfort Care  Code Status:    Code Status Orders  (From admission, onward)           Start     Ordered   06/08/2022 1122  Do not attempt resuscitation (DNR)  Continuous       Question Answer Comment  If patient has no pulse and is not breathing Do Not Attempt Resuscitation   If patient has a pulse and/or is breathing: Medical Treatment Goals COMFORT MEASURES: Keep clean/warm/dry, use medication by any route; positioning, wound care and other measures to relieve pain/suffering; use oxygen, suction/manual treatment of airway obstruction for comfort; do not transfer unless for comfort needs.   Consent: Discussion documented in EHR or advanced directives reviewed      06/08/22 1122           Code Status History     Date Active Date Inactive Code Status Order ID Comments User Context   June 08, 2022 0759 06/08/22 1122 DNR [id number]  Candee Furbish, MD ED       Prognosis:  Hours - Days  Discharge Planning: Anticipated Hospital Death  Care plan was discussed with IDT Discussed with 2 daughters. Thank you for allowing the Palliative Medicine Team to assist in the care of this patient.  mod MDM     Greater than 50%  of this time was spent counseling and coordinating care related to the above assessment and plan.  Loistine Chance, MD  Please contact Palliative Medicine Team phone at [phone] for questions and concerns.

## 2022-05-31 NOTE — Progress Notes (Signed)
[facility]  Name: Sarah Boone MRN: [id number] DOB: [birth year]  CC: Fever  Summary: Sarah Boone is a 89 y.o. female with history of dementia admitted from SNF on 05/20/2022  with fever, abdominal distention, and hypotension.  Found to have lactic acidosis, leukocytosis, hypocalcemia, and hypoxia.  CT abdomen showed obstructing Rt renal stone.  Family agreed to trial of antibiotics and pressors, but not escalation of care beyond this.  Palliative care was consulted, and after further discussion with family decision made to transition to comfort care on 1/26.  Subjective: Resting comfortably.  Vital signs: BP 94/67 (BP Location: Right Arm)   Pulse (!) 102   Temp 98.4 F (36.9 C) (Axillary)   Resp 20   SpO2 99%   Physical exam: Snoring respiratory pattern.  Dry mucosa.  Appear comfortable.  Assessment: Septic shock POA Infected right renal calculus Ischemic colitis Dementia History of atrial fibrillation History of hypertension Esophageal dysmotility s/p PEG DM type 2 poorly controlled with hyperglycemia Hypothyroidism  Plan: - DNR/DNI - comfort measures in place - palliative care consulted  Updated pt's daughters at bedside.  Chesley Mires, MD [facility] Pager - [phone] or [phone] 06-01-22, 10:49 AM

## 2022-05-31 NOTE — Progress Notes (Signed)
[facility] called. Confirmation # [id number].

## 2022-05-31 NOTE — Progress Notes (Signed)
46ml from Fentanyl gtt wasted with Jearl Klinefelter, charge RN.

## 2022-05-31 NOTE — Progress Notes (Signed)
RN in room to check on patient. Patient restless and grunting with irregular breathing. RN gave PRN pain medication and medication for secretions. Patient's HR 200, RR 30's, O2 saturations of 72%. Family notified with changes. Will continue to monitor for pain, restlessness, and agitation.

## 2022-05-31 DEATH — deceased

## 2022-11-03 IMAGING — CT CT HEAD W/O CM
3 series · 15 of 47 positions shown, 18 images · non-contrast
Comparison: None.

CLINICAL DATA: Mental status change.  Recent pneumonia



[Series 2: head wo · axial · 0.47mm/px · z∈[-119,+16]mm · 9 of 33 slices shown, 12 images]
[im 3/33  brain]
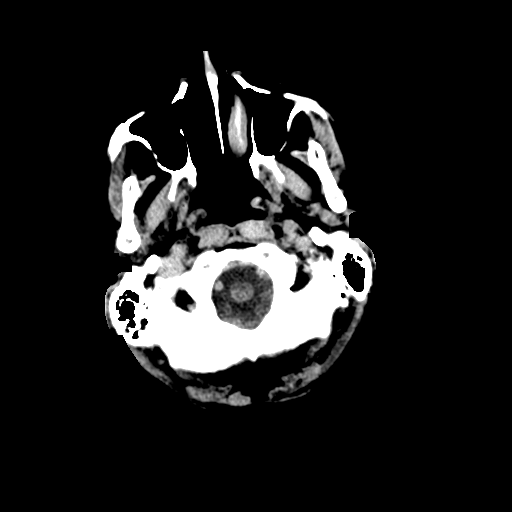
[im 3/33  bone]
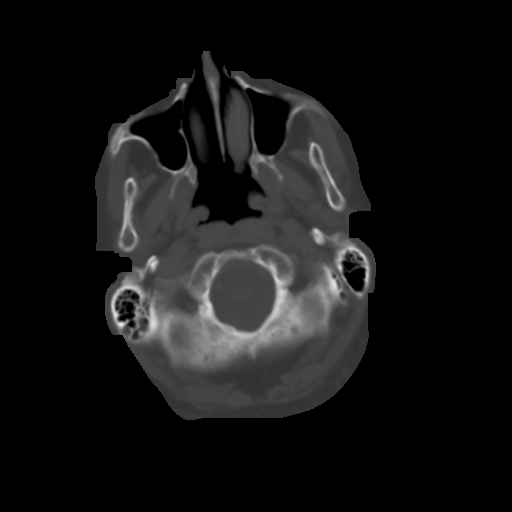
[im 6/33  brain]
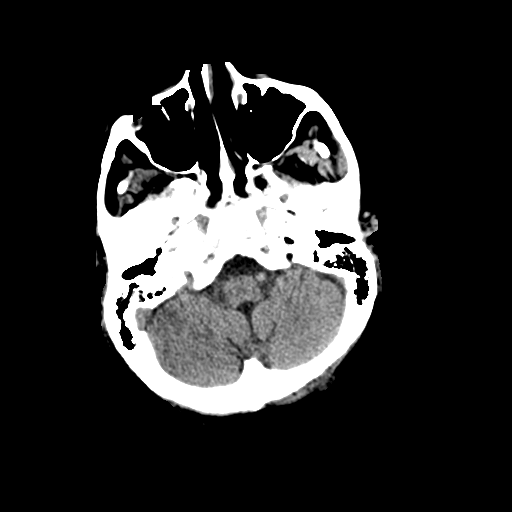
[im 9/33  brain]
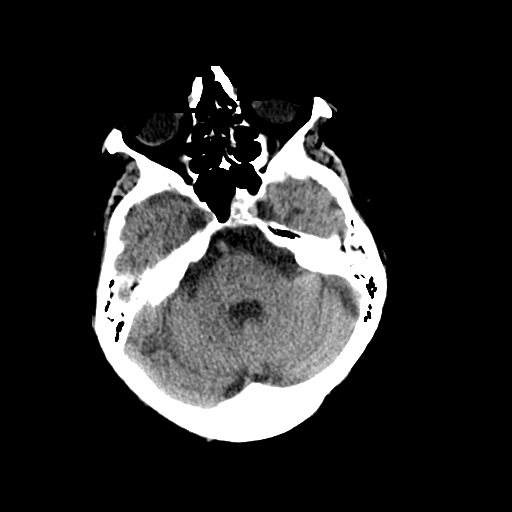
[im 13/33  brain]
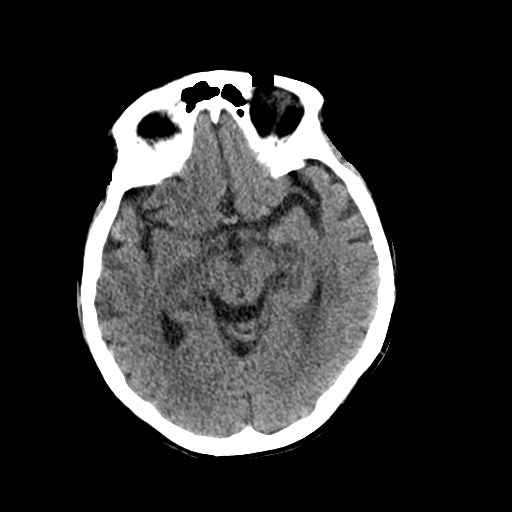
[im 17/33  brain]
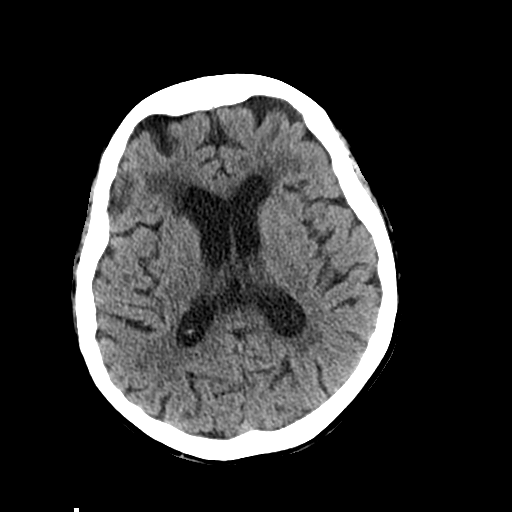
[im 17/33  bone]
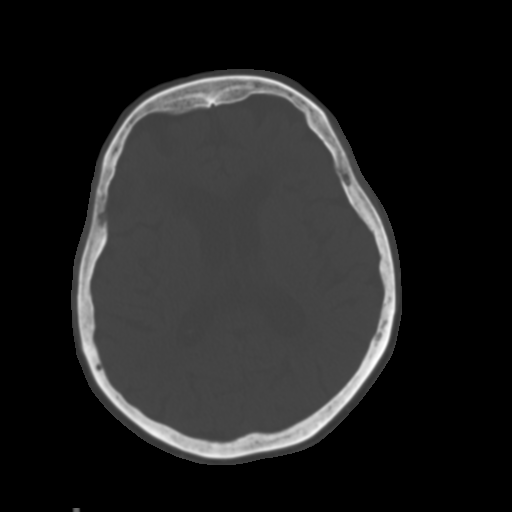
[im 20/33  brain]
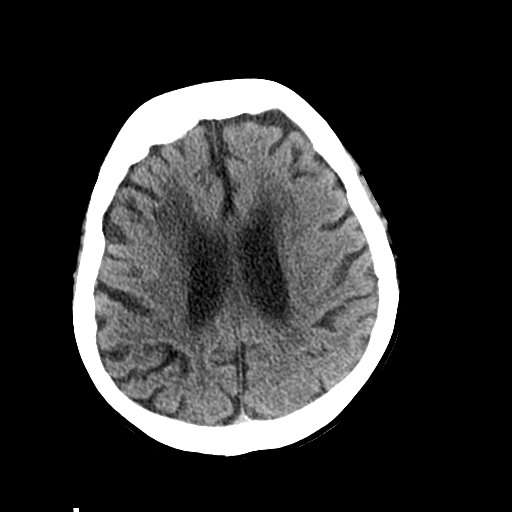
[im 24/33  brain]
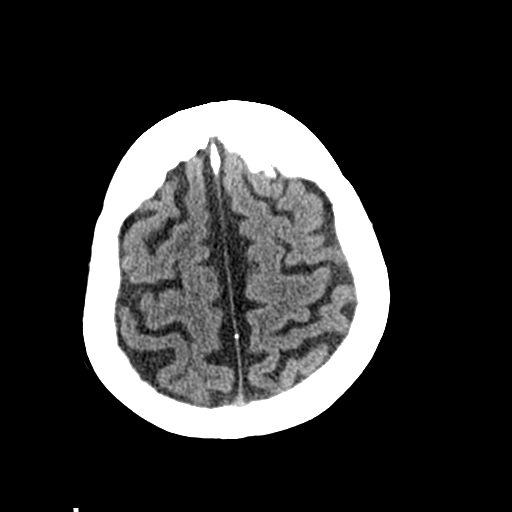
[im 27/33  brain]
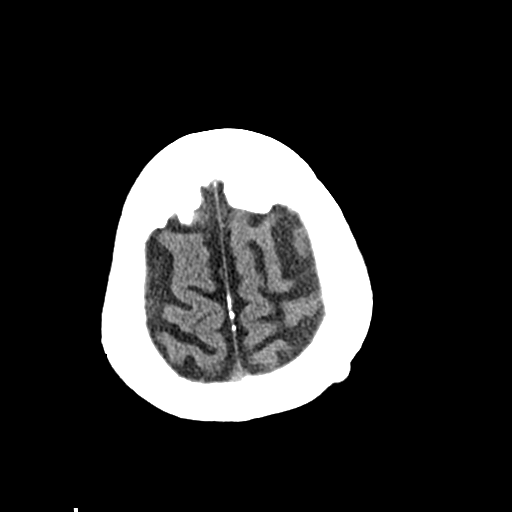
[im 30/33  brain]
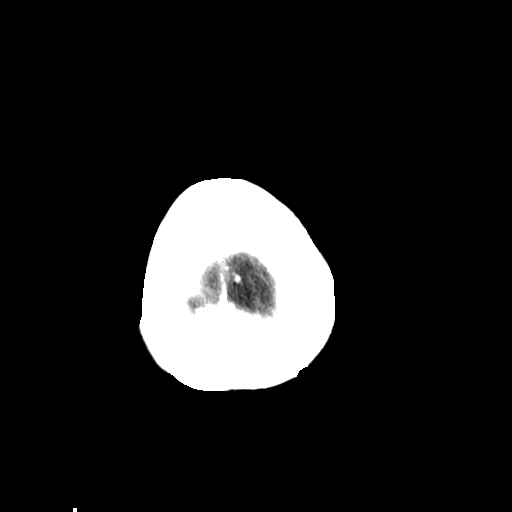
[im 30/33  bone]
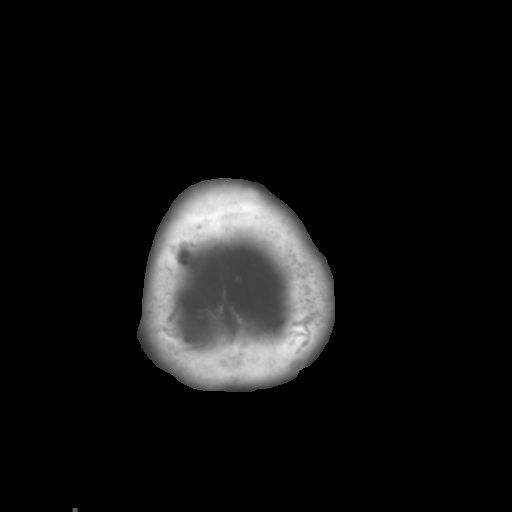

[Series 5: coronal soft tissue · coronal · 0.31mm/px · 3 of 72 slices shown]
[im 24/72  brain]
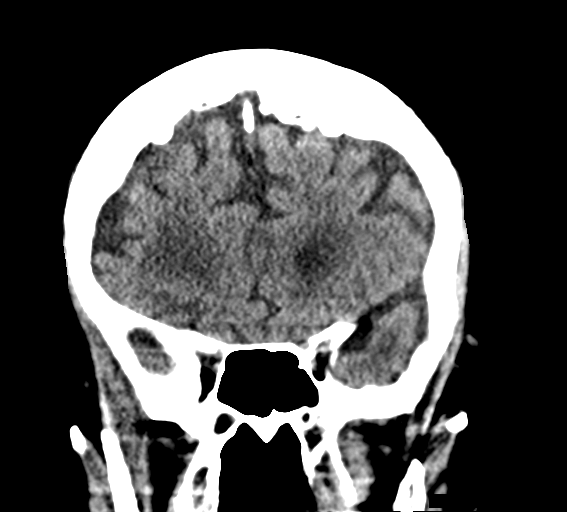
[im 32/72  brain]
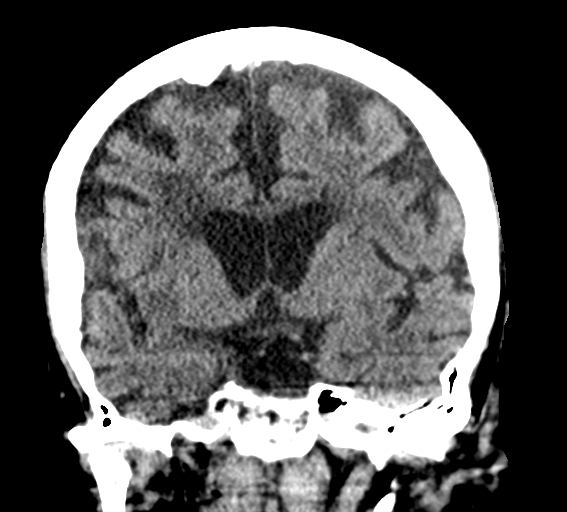
[im 40/72  brain]
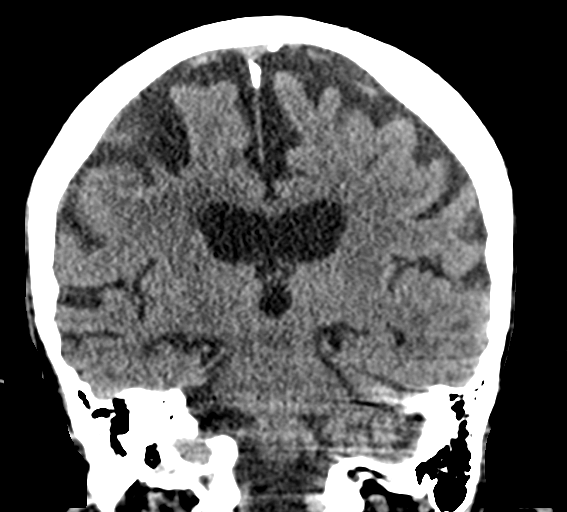

[Series 6: sagittal soft tissue · sagittal · 0.31mm/px · 3 of 58 slices shown]
[im 20/58  brain]
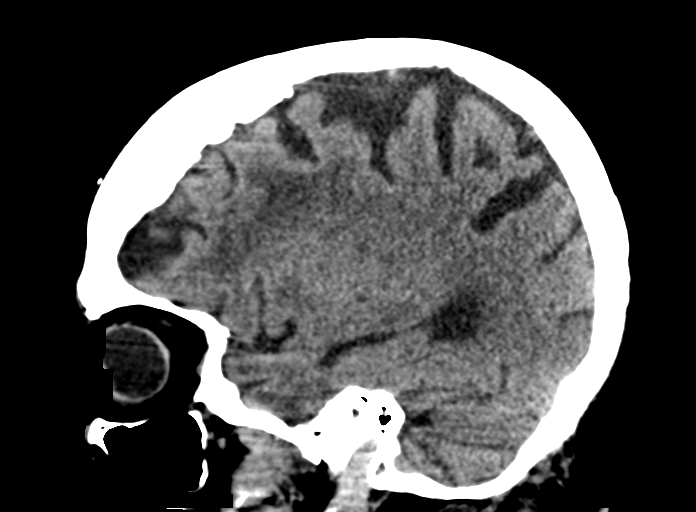
[im 29/58  brain]
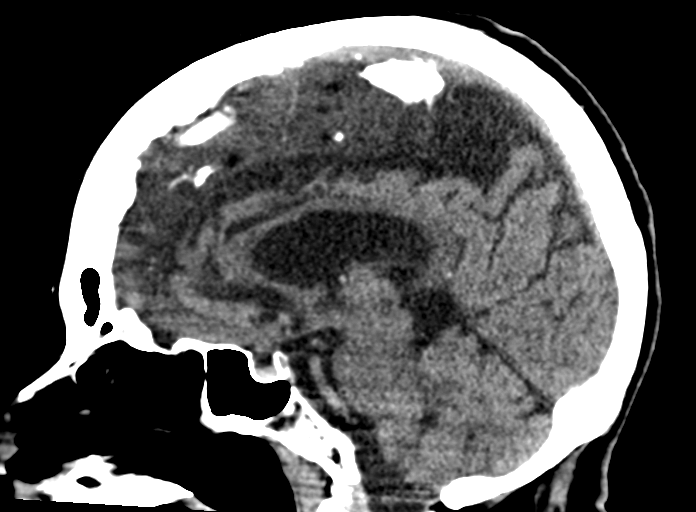
[im 39/58  brain]
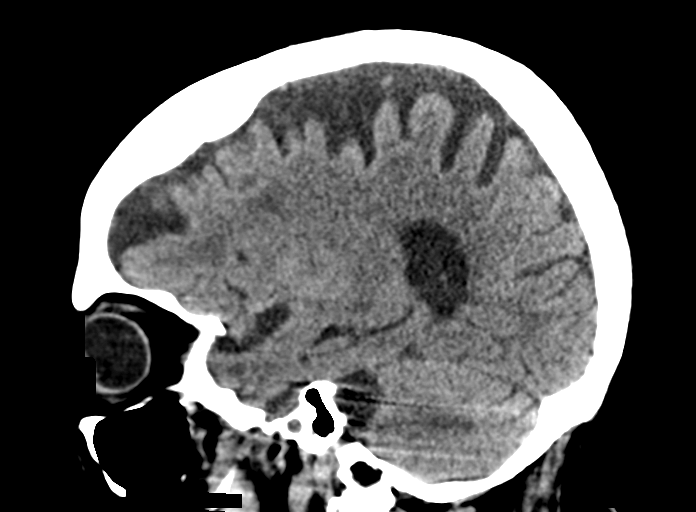

[15 of 47 positions shown; findings below may reference images not displayed]

FINDINGS: Brain: Generalized atrophy. Hypodensity in the frontal white matter
bilaterally is symmetric and appears chronic.

Negative for acute infarct, hemorrhage, mass

Vascular: Negative for hyperdense vessel

Skull: Negative

Sinuses/Orbits: Paranasal sinuses clear. Bilateral cataract
extraction

Other: None
IMPRESSION: No acute intracranial abnormality.

Atrophy and chronic microvascular ischemic changes in the white
matter

## 2022-11-03 IMAGING — CR DG CHEST 2V
2 series · 2 of 2 positions shown · non-contrast
Comparison: January 13, 2021.

CLINICAL DATA: Cough.

EXAM:
CHEST - 2 VIEW

[x chest ap]
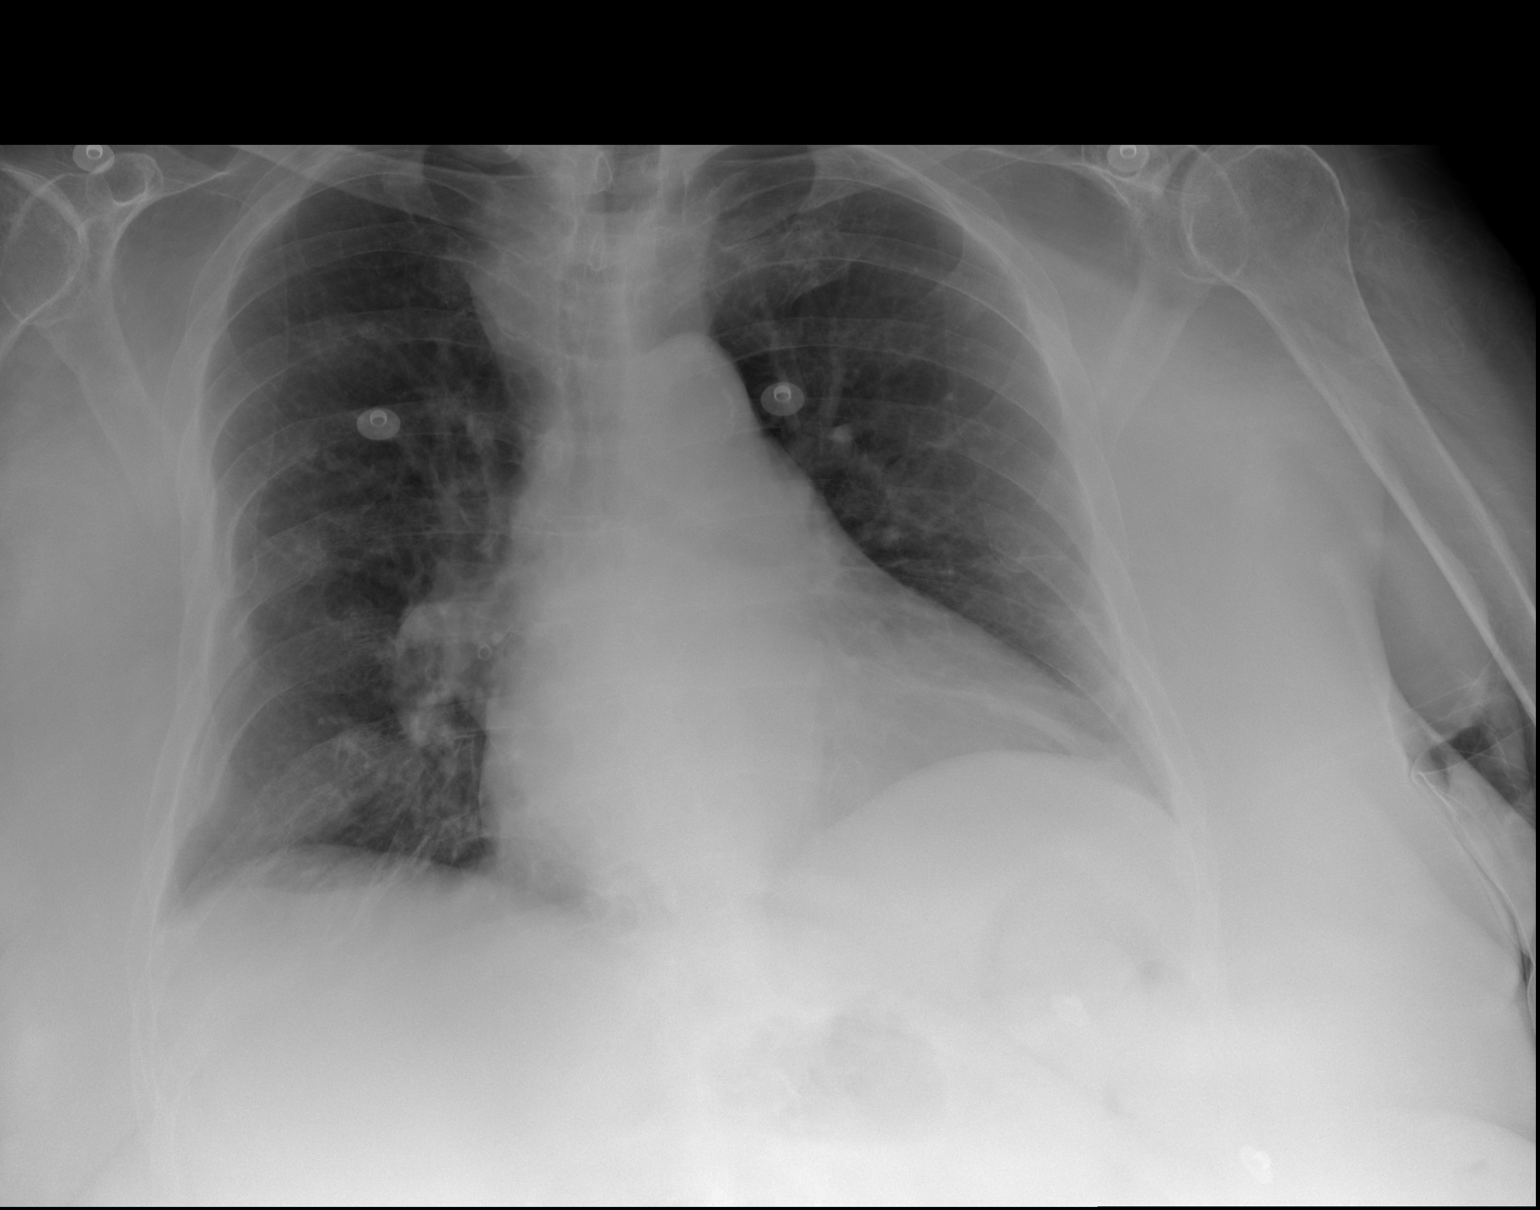

[w chest lat]
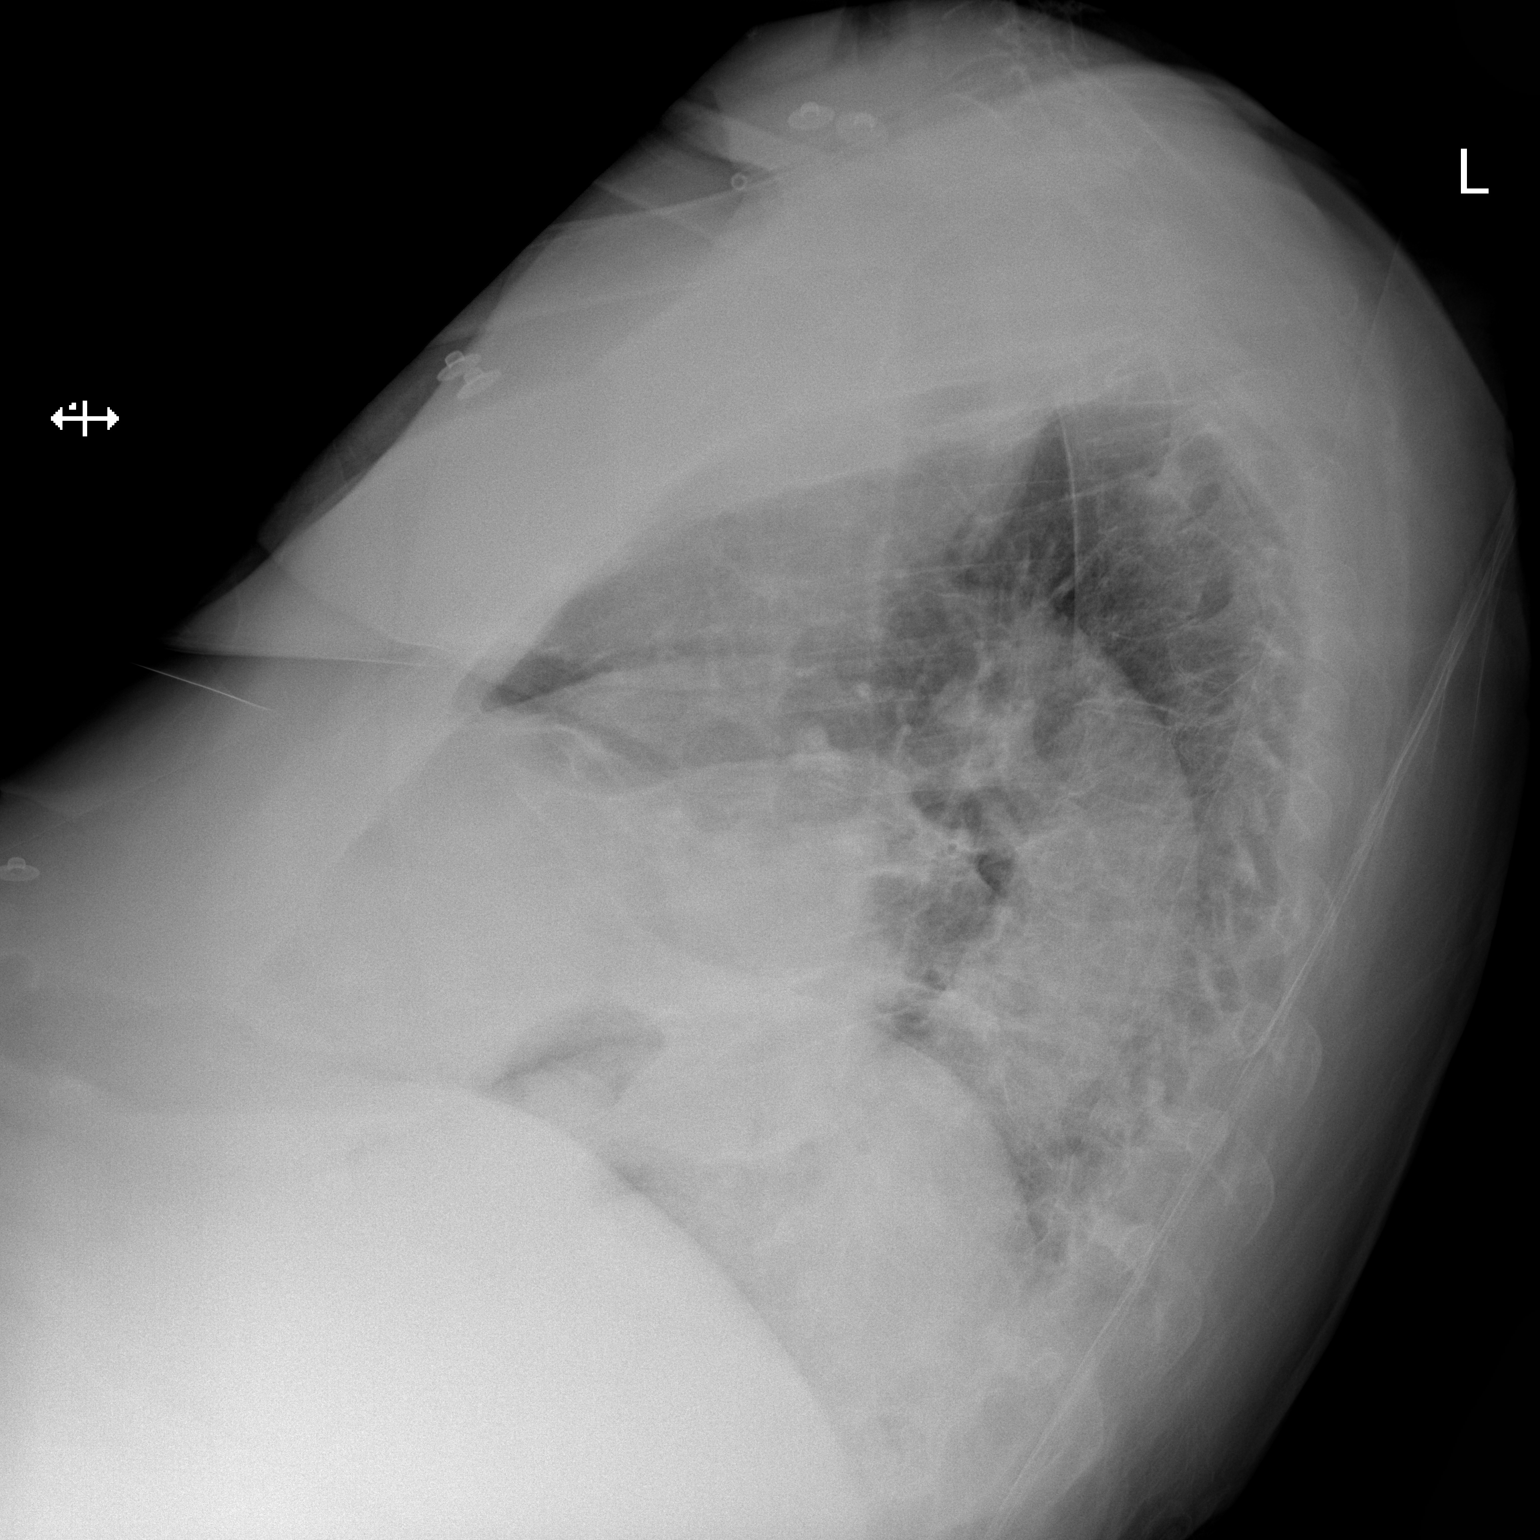

[2 of 2 positions shown; findings below may reference images not displayed]

FINDINGS: Stable cardiomediastinal silhouette. No acute pulmonary disease is
noted. Old right rib fractures are noted.
IMPRESSION: No active cardiopulmonary disease.
